# Patient Record
Sex: Female | Born: 1980 | Race: Black or African American | Hispanic: No | Marital: Single | Smoking: Never smoker
Health system: Southern US, Community
[De-identification: ages and names within clinical notes are randomized; demographics above are authoritative.]

## PROBLEM LIST (undated history)

## (undated) ENCOUNTER — Inpatient Hospital Stay (HOSPITAL_COMMUNITY): Payer: Self-pay

## (undated) DIAGNOSIS — O1495 Unspecified pre-eclampsia, complicating the puerperium: Secondary | ICD-10-CM

## (undated) DIAGNOSIS — IMO0002 Reserved for concepts with insufficient information to code with codable children: Secondary | ICD-10-CM

## (undated) DIAGNOSIS — Z8659 Personal history of other mental and behavioral disorders: Secondary | ICD-10-CM

## (undated) DIAGNOSIS — R87629 Unspecified abnormal cytological findings in specimens from vagina: Secondary | ICD-10-CM

## (undated) DIAGNOSIS — D649 Anemia, unspecified: Secondary | ICD-10-CM

## (undated) DIAGNOSIS — R87619 Unspecified abnormal cytological findings in specimens from cervix uteri: Secondary | ICD-10-CM

## (undated) HISTORY — PX: OTHER SURGICAL HISTORY: SHX169

## (undated) HISTORY — DX: Unspecified abnormal cytological findings in specimens from vagina: R87.629

## (undated) HISTORY — DX: Anemia, unspecified: D64.9

## (undated) HISTORY — DX: Reserved for concepts with insufficient information to code with codable children: IMO0002

## (undated) HISTORY — PX: WISDOM TOOTH EXTRACTION: SHX21

## (undated) HISTORY — DX: Personal history of other mental and behavioral disorders: Z86.59

## (undated) HISTORY — DX: Unspecified pre-eclampsia, complicating the puerperium: O14.95

## (undated) HISTORY — DX: Unspecified abnormal cytological findings in specimens from cervix uteri: R87.619

---

## 2008-06-29 ENCOUNTER — Ambulatory Visit: Payer: Self-pay | Admitting: Occupational Medicine

## 2008-06-30 ENCOUNTER — Ambulatory Visit: Payer: Self-pay | Admitting: Family Medicine

## 2008-06-30 DIAGNOSIS — G4700 Insomnia, unspecified: Secondary | ICD-10-CM | POA: Insufficient documentation

## 2008-07-01 ENCOUNTER — Encounter: Payer: Self-pay | Admitting: Family Medicine

## 2008-07-01 LAB — CONVERTED CEMR LAB
Hemoglobin: 11.6 g/dL — ABNORMAL LOW (ref 12.0–15.0)
MCHC: 32.2 g/dL (ref 30.0–36.0)
RBC: 4.56 M/uL (ref 3.87–5.11)
RDW: 14.3 % (ref 11.5–15.5)
TSH: 1.124 microintl units/mL (ref 0.350–4.500)
WBC: 7.3 10*3/uL (ref 4.0–10.5)

## 2008-07-02 LAB — CONVERTED CEMR LAB
Folate: >20 ng/mL
Vitamin B-12: 804 pg/mL (ref 211–911)

## 2008-07-16 ENCOUNTER — Ambulatory Visit: Payer: Self-pay | Admitting: Family Medicine

## 2008-11-16 ENCOUNTER — Other Ambulatory Visit: Admission: RE | Admit: 2008-11-16 | Discharge: 2008-11-16 | Payer: Self-pay | Admitting: Family Medicine

## 2008-11-16 ENCOUNTER — Encounter: Payer: Self-pay | Admitting: Family Medicine

## 2008-11-16 ENCOUNTER — Ambulatory Visit: Payer: Self-pay | Admitting: Family Medicine

## 2008-11-16 LAB — CONVERTED CEMR LAB: Beta hcg, urine, semiquantitative: NEGATIVE

## 2008-11-17 ENCOUNTER — Encounter: Payer: Self-pay | Admitting: Family Medicine

## 2008-11-18 ENCOUNTER — Encounter: Payer: Self-pay | Admitting: Family Medicine

## 2008-11-18 DIAGNOSIS — D649 Anemia, unspecified: Secondary | ICD-10-CM | POA: Insufficient documentation

## 2008-11-18 LAB — CONVERTED CEMR LAB
ALT: 8 units/L (ref 0–35)
AST: 12 units/L (ref 0–37)
Albumin: 3.9 g/dL (ref 3.5–5.2)
Alkaline Phosphatase: 57 units/L (ref 39–117)
Basophils Absolute: 0 10*3/uL (ref 0.0–0.1)
Basophils Relative: 0 % (ref 0–1)
Calcium: 8.3 mg/dL — ABNORMAL LOW (ref 8.4–10.5)
Cholesterol: 182 mg/dL (ref 0–200)
Eosinophils Absolute: 0.1 10*3/uL (ref 0.0–0.7)
Eosinophils Relative: 1 % (ref 0–5)
Glucose, Bld: 91 mg/dL (ref 70–99)
HDL: 65 mg/dL (ref >39)
Lymphocytes Relative: 26 % (ref 12–46)
Lymphs Abs: 1.9 10*3/uL (ref 0.7–4.0)
MCHC: 31.9 g/dL (ref 30.0–36.0)
MCV: 80.4 fL (ref 78.0–100.0)
Monocytes Absolute: 0.5 10*3/uL (ref 0.1–1.0)
Monocytes Relative: 7 % (ref 3–12)
Neutro Abs: 4.8 10*3/uL (ref 1.7–7.7)
Total CHOL/HDL Ratio: 2.8
Total Protein: 6.8 g/dL (ref 6.0–8.3)
UIBC: 264 ug/dL
VLDL: 8 mg/dL (ref 0–40)

## 2008-11-20 ENCOUNTER — Encounter: Payer: Self-pay | Admitting: Family Medicine

## 2008-11-20 DIAGNOSIS — R8789 Other abnormal findings in specimens from female genital organs: Secondary | ICD-10-CM

## 2008-11-25 LAB — CONVERTED CEMR LAB
Hgb A: 97.2 % (ref 96.8–97.8)
Hgb F Quant: 0 % (ref 0.0–2.0)
Hgb S Quant: 0 % (ref 0.0–0.0)

## 2009-03-23 ENCOUNTER — Ambulatory Visit: Payer: Self-pay | Admitting: Family Medicine

## 2009-03-24 ENCOUNTER — Encounter: Payer: Self-pay | Admitting: Obstetrics & Gynecology

## 2009-03-24 LAB — CONVERTED CEMR LAB

## 2009-05-06 ENCOUNTER — Ambulatory Visit: Payer: Self-pay | Admitting: Family Medicine

## 2009-05-06 ENCOUNTER — Encounter: Admission: RE | Admit: 2009-05-06 | Discharge: 2009-05-06 | Payer: Self-pay | Admitting: Family Medicine

## 2009-05-06 DIAGNOSIS — R519 Headache, unspecified: Secondary | ICD-10-CM | POA: Insufficient documentation

## 2009-05-06 DIAGNOSIS — R0789 Other chest pain: Secondary | ICD-10-CM

## 2009-05-06 DIAGNOSIS — R51 Headache: Secondary | ICD-10-CM

## 2009-05-07 LAB — CONVERTED CEMR LAB
ALT: 9 units/L (ref 0–35)
Albumin: 4.4 g/dL (ref 3.5–5.2)
Alkaline Phosphatase: 56 units/L (ref 39–117)
Basophils Absolute: 0 10*3/uL (ref 0.0–0.1)
Basophils Relative: 0 % (ref 0–1)
CO2: 23 meq/L (ref 19–32)
Calcium: 9.4 mg/dL (ref 8.4–10.5)
Creatinine, Ser: 0.63 mg/dL (ref 0.40–1.20)
Eosinophils Relative: 1 % (ref 0–5)
Glucose, Bld: 83 mg/dL (ref 70–99)
HCT: 34.8 % — ABNORMAL LOW (ref 36.0–46.0)
Hemoglobin: 10.9 g/dL — ABNORMAL LOW (ref 12.0–15.0)
Iron: 80 ug/dL (ref 42–145)
Lymphocytes Relative: 30 % (ref 12–46)
Monocytes Absolute: 0.6 10*3/uL (ref 0.1–1.0)
Neutro Abs: 4.1 10*3/uL (ref 1.7–7.7)
Saturation Ratios: 23 % (ref 20–55)
Sodium: 144 meq/L (ref 135–145)
TIBC: 350 ug/dL (ref 250–470)
Total Bilirubin: 0.7 mg/dL (ref 0.3–1.2)

## 2009-05-11 ENCOUNTER — Ambulatory Visit: Payer: Self-pay | Admitting: Obstetrics & Gynecology

## 2009-06-09 ENCOUNTER — Ambulatory Visit: Payer: Self-pay | Admitting: Obstetrics and Gynecology

## 2009-08-31 ENCOUNTER — Ambulatory Visit: Payer: Self-pay | Admitting: Family

## 2009-08-31 ENCOUNTER — Encounter (INDEPENDENT_AMBULATORY_CARE_PROVIDER_SITE_OTHER): Payer: Self-pay | Admitting: *Deleted

## 2009-08-31 DIAGNOSIS — R5383 Other fatigue: Secondary | ICD-10-CM

## 2009-08-31 DIAGNOSIS — R5381 Other malaise: Secondary | ICD-10-CM | POA: Insufficient documentation

## 2009-09-02 ENCOUNTER — Encounter: Payer: Self-pay | Admitting: Family Medicine

## 2009-09-02 ENCOUNTER — Encounter: Payer: Self-pay | Admitting: Family

## 2009-09-02 LAB — CONVERTED CEMR LAB
Basophils Relative: 0 % (ref 0–1)
Eosinophils Absolute: 0.1 10*3/uL (ref 0.0–0.7)
Eosinophils Relative: 1 % (ref 0–5)
Folate: 15 ng/mL
HCT: 35.5 % — ABNORMAL LOW (ref 36.0–46.0)
MCV: 79.8 fL (ref 78.0–100.0)
RDW: 14 % (ref 11.5–15.5)
TSH: 1.309 microintl units/mL (ref 0.350–4.500)
Vitamin B-12: 878 pg/mL (ref 211–911)

## 2009-12-28 ENCOUNTER — Ambulatory Visit: Payer: Self-pay | Admitting: Family Medicine

## 2009-12-28 DIAGNOSIS — F411 Generalized anxiety disorder: Secondary | ICD-10-CM

## 2010-05-17 NOTE — Assessment & Plan Note (Signed)
Summary: chest pain, anxiety, insomnia   Vital Signs:  Patient profile:   30 year old female Height:      67 inches Weight:      165 pounds Pulse rate:   781 / minute BP sitting:   132 / 76  (right arm) Cuff size:   regular  Vitals Entered By: Avon Gully CMA, Duncan Dull) (December 28, 2009 3:32 PM) CC: chest pain since yesterday, hurts when breathing in or certain movements   Primary Care Nzinga Ferran:  Nani Gasser MD  CC:  chest pain since yesterday and hurts when breathing in or certain movements.  History of Present Illness: chest pain since yesterday, hurts when breathing in or certain movements.  Thinks may have had a mild panic attack. Left arm started to tinlge but din't go completey numbn. Then started to get shooting pains in her chest on the right and left side of the chest. Doesn't feel depressed. Not sleping well. Feeling more anxious.  Fatigued on teh weekend. Occ nausea. More frequent HA. Not sure if tension HA. No diaphoresis.  CP is worse with a deep breaths adn will feel a momentary sharp pain. CHest is non tender to touch.  No fever or cold sxs. Has tried valerian root for one night.   Current Medications (verified): 1)  None  Allergies (verified): 1)  * Fluoxetine  Comments:  Nurse/Medical Assistant: The patient's medications and allergies were reviewed with the patient and were updated in the Medication and Allergy Lists. Avon Gully CMA, Duncan Dull) (December 28, 2009 3:33 PM)  Family History: Reviewed history from 05/06/2009 and no changes required. Family History Diabetes 1st degree relative Family History High cholesterol Family History Hypertension Mother with hx CHF.   Social History: Reviewed history from 06/30/2008 and no changes required. Single.  3 kids. Single mom.   Never Smoked Alcohol use-no Drug use-no Regular exercise-no  Physical Exam  General:  Well-developed,well-nourished,in no acute distress; alert,appropriate and  cooperative throughout examination Head:  Normocephalic and atraumatic without obvious abnormalities. No apparent alopecia or balding. Neck:  No deformities, masses, or tenderness noted. NO TM.  Lungs:  Normal respiratory effort, chest expands symmetrically. Lungs are clear to auscultation, no crackles or wheezes. Heart:  Normal rate and regular rhythm. S1 and S2 normal without gallop, murmur, click, rub or other extra sounds. No carotid or abdominal bruits.  Abdomen:  Bowel sounds positive,abdomen soft and non-tender without masses, organomegaly or hernias noted. Extremities:  No LE edema.  Skin:  no rashes.   Cervical Nodes:  No lymphadenopathy noted Psych:  Cognition and judgment appear intact. Alert and cooperative with normal attention span and concentration. No apparent delusions, illusions, hallucinations   Impression & Recommendations:  Problem # 1:  CHEST PAIN, ATYPICAL (ICD-786.59)  Last 2 sets of lab were normal EKG today shows NSR, no acute changes GAD-7 score today is 19 and she is not sleeping. Discussed treatment for her anxiety and to work on sleep and see if the chest resovles. She has no red flags to indicate that this is cardiac.   Orders: EKG w/ Interpretation (93000)  Problem # 2:  INSOMNIA (ICD-780.52) Trial of medication. Discusssed use of trazodone to avoid dependency. She can also try the valerian root.   F/U in 1 month. Also reviewed sleep hygiene.   Problem # 3:  ANXIETY DISORDER, GENERALIZED (ICD-300.02) Discussed dx. HIhgly recommend therapy and medication such as SSSRI but she was not interestedin a daily medication.Did give her a few tabs of xanax  to use for rescure but explained that this really need to last 2 months.   Her updated medication list for this problem includes:    Trazodone Hcl 50 Mg Tabs (Trazodone hcl) .Marland Kitchen... 1/2 to 1 tab at bedtime as needed insomnia.    Alprazolam 0.25 Mg Tabs (Alprazolam) .Marland Kitchen... Take 1-2 tablet by mouth once a day as  needed for anxiety  Complete Medication List: 1)  Trazodone Hcl 50 Mg Tabs (Trazodone hcl) .... 1/2 to 1 tab at bedtime as needed insomnia. 2)  Alprazolam 0.25 Mg Tabs (Alprazolam) .... Take 1-2 tablet by mouth once a day as needed for anxiety  Patient Instructions: 1)  Start the trazodone at bedtime 2)  Please schedule a follow-up appointment in 1 month for sleep and mood. 3)  For constipation increase water intake.   4)  Can start benefiber up to 3 x a day or a stool softener 5)  Can use miralax to get things going. 6)    Prescriptions: ALPRAZOLAM 0.25 MG TABS (ALPRAZOLAM) Take 1-2 tablet by mouth once a day as needed for anxiety  #15 x 0   Entered and Authorized by:   Nani Gasser MD   Signed by:   Nani Gasser MD on 12/28/2009   Method used:   Printed then faxed to ...       [facility]  [place] [phone][facility] (retail[facility]       [address] [place], [place]  [address]       Ph: [phone] or [phone]       Fax: [phone]   RxID:   [id number] TRAZODONE HCL 50 MG TABS (TRAZODONE HCL) 1/2 to 1 tab at bedtime as needed insomnia.  #30 x 1   Entered and Authorized by:   Nani Gasser MD   Signed by:   Nani Gasser MD on 12/28/2009   Method used:   [facility]ronically to        [facility] [phone]* (retail)       [address] [place], [place]  [address]       Ph: [phone] or [phone]       Fax: [phone]   RxID:   [id number]

## 2010-05-17 NOTE — Assessment & Plan Note (Signed)
Summary: Atypical CP, HA   Vital Signs:  Patient profile:   30 year old female Height:      67 inches Weight:      156 pounds BMI:     24.52 O2 Sat:      100 % on Room air Pulse rate:   70 / minute BP sitting:   113 / 70  (left arm) Cuff size:   regular  Vitals Entered By: Kathlene November (May 06, 2009 8:43 AM)  O2 Flow:  Room air CC: H/A alot lately- no photophobia or nausea- does get some pain in left side of chest as well   Primary Care Provider:  Nani Gasser MD  CC:  H/A alot lately- no photophobia or nausea- does get some pain in left side of chest as well.  History of Present Illness: H/A alot lately- throbbint, no photophobia or nausea- does get some pain in left side of chest as well. Denies any CP or pressure. Almost hard to get a breath in.  a couple of days ago started getting palpitations.  Tried laying on different sides to calm it down. Then that side of chest became sore.  The Chest pain lasted a couple of hours.  Palpitations lasted about an hour (has happened 2 nights in a row).  doesn' tfeel very anxious or stressed out. Normally drinks 2 sodas a day but this is normal for her. no increase last couple of days. no GERD sxs.   HA is sometimes execabates by exercise. Getting HA every 2 days. Can last for hours.  Usually take IBU  200mg  and that usually helps. No dizziness.  Feel s lightheaded.   Current Medications (verified): 1)  None  Allergies (verified): 1)  * Fluoxetine  Comments:  Nurse/Medical Assistant: The patient's medications and allergies were reviewed with the patient and were updated in the Medication and Allergy Lists. Kathlene November (May 06, 2009 8:44 AM)  Past History:  Social History: Last updated: 06/30/2008 Single.  3 kids. Single mom.   Never Smoked Alcohol use-no Drug use-no Regular exercise-no  Family History: Family History Diabetes 1st degree relative Family History High cholesterol Family History  Hypertension Mother with hx CHF.   Social History: Reviewed history from 06/30/2008 and no changes required. Single.  3 kids. Single mom.   Never Smoked Alcohol use-no Drug use-no Regular exercise-no  Physical Exam  General:  Well-developed,well-nourished,in no acute distress; alert,appropriate and cooperative throughout examination Head:  Normocephalic and atraumatic without obvious abnormalities. No apparent alopecia or balding. Eyes:  No corneal or conjunctival inflammation noted. EOMI. Perrla. Ears:  External ear exam shows no significant lesions or deformities.  Otoscopic examination reveals clear canals, tympanic membranes are intact bilaterally without bulging, retraction, inflammation or discharge. Hearing is grossly normal bilaterally. Nose:  External nasal examination shows no deformity or inflammation. Nasal mucosa are pink and moist without lesions or exudates. Mouth:  Oral mucosa and oropharynx without lesions or exudates.  Teeth in good repair. Neck:  No deformities, masses, or tenderness noted. No TM.  Lungs:  Normal respiratory effort, chest expands symmetrically. Lungs are clear to auscultation, no crackles or wheezes. Heart:  Normal rate and regular rhythm. S1 and S2 normal without gallop, murmur, click, rub or other extra sounds.No mummur lying flat. No abdominal or carotid bruits.  Pulses:  Radial 2+  Extremities:  No LE edema.  Skin:  no rashes.   Cervical Nodes:  No lymphadenopathy noted Psych:  Cognition and judgment appear intact. Alert and  cooperative with normal attention span and concentration. No apparent delusions, illusions, hallucinations   Impression & Recommendations:  Problem # 1:  CHEST PAIN, ATYPICAL (ICD-786.59) Unclear etiology.  Will rule out thyroid d/o, anemia, etc.  If normal and has another episode then will schedule for cardiac monitor.  She is not fasting this AM. CXR to rule out any pulmonary process. EKG with NSR, rate 58 bpm, no acute  changes.   Orders: T-Comprehensive Metabolic Panel [id number]) T-CBC w/Diff [id number]) T-TSH [phone]) T-Iron [id number]) T-Iron Binding Capacity (TIBC) ([id number]) T-Chest x-ray, 2 views (71020) EKG w/ Interpretation (93000)  Problem # 2:  HEADACHE (ICD-784.0) No visoin changes.  Pain is throbbing andusuall relieved with 200mg  IBU. consider tension vs migraine, thought pt feels the pain is not severe and no photo or phonophobia.  Will rule out thyroid and anmeia as above. I really dn't think she is sleeing well and this may be part of the issue.

## 2010-05-17 NOTE — Progress Notes (Signed)
Summary: GAD 7/[name]  GAD 7/[name]   Imported By: Sherian Rein 01/07/2010 11:54:21  _____________________________________________________________________  External Attachment:    Type:   Image     Comment:   External Document

## 2010-05-17 NOTE — Letter (Signed)
Summary: Out of Work  [facility]  [address]   [place], [place] [address]   Phone: [phone]  Fax: [phone]    Aug 31, 2009   Employee:  MAUDENE STOTLER    To Whom It May Concern:   For Medical reasons, please excuse the above named employee from work for the following dates:  Start:   08/31/2009  End:   08/31/2009  If you need additional information, please feel free to contact our office.         Sincerely,    Mervin Kung, CMA Sandford Craze, FNP

## 2010-05-17 NOTE — Assessment & Plan Note (Signed)
Summary: DIZZY SPILLS//VGJ   Vital Signs:  Patient profile:   30 year old female Height:      67 inches Weight:      160.25 pounds BMI:     25.19 Temp:     98.5 degrees F oral Pulse rate:   67 / minute Pulse rhythm:   regular Resp:     18 per minute BP sitting:   133 / 79  (right arm) Cuff size:   regular  Vitals Entered By: Mervin Kung CMA (Aug 31, 2009 4:43 PM) CC: room 5  Pt states she has had headaches nearly every day x 3 weeks, dizziness x 1 week and feels like she could sleep all the time. Is Patient Diabetic? No   Primary Care Provider:  Nani Gasser MD  CC:  room 5  Pt states she has had headaches nearly every day x 3 weeks and dizziness x 1 week and feels like she could sleep all the time.Marland Kitchen  History of Present Illness: Patient is a 30 year old female who presents with complaint of headache, mild dizziness and fatigue.  Notes that her ears have been "stopped" up.  Has been using motrin PRN for HA with good relief. Headaches have been going on for " a while" but have beome more frequent and are associated with extreme fatigue.  Denies palpitations.  Notes + history of anemia but denies heavy menstrual bleeding.  Allergies: 1)  * Fluoxetine  Physical Exam  General:  Well-developed,well-nourished,in no acute distress; alert,appropriate and cooperative throughout examination Head:  Normocephalic and atraumatic without obvious abnormalities. No apparent alopecia or balding. Eyes:  PERRLA Ears:  Bilateral TM's are dull with light yellow effusions.  No erythema or bulging noted Neck:  No deformities, masses, or tenderness noted. Lungs:  Normal respiratory effort, chest expands symmetrically. Lungs are clear to auscultation, no crackles or wheezes. Heart:  Normal rate and regular rhythm. S1 and S2 normal without gallop, murmur, click, rub or other extra sounds. Abdomen:  Bowel sounds positive,abdomen soft and non-tender without masses, organomegaly or hernias  noted.   Impression & Recommendations:  Problem # 1:  HEADACHE (ICD-784.0) Assessment Deteriorated Patient reports that she has good HA relief with as needed motrin.  Will continue as needed.    Problem # 2:  FATIGUE (ICD-780.79) Assessment: New I am concerned that she may have worsening anemia as cause for her fatigue and HA.  Last Hgb in 1/11 was 10.7.  Will check CBC and TSH.   Orders: T-TSH [id number]) T-CBC w/Diff [id number])  Problem # 3:  OTITIS MEDIA (ICD-382.9) Assessment: New Will treat with amoxicillin.  This also may be contributing to her fatigue. Her updated medication list for this problem includes:    Amoxicillin 500 Mg Cap (Amoxicillin) .Marland Kitchen... Take 1 capsule by mouth three times a day x 10 days  Complete Medication List: 1)  Amoxicillin 500 Mg Cap (Amoxicillin) .... Take 1 capsule by mouth three times a day x 10 days  Patient Instructions: 1)  Please complete lab work and follow up in 2 weeks Prescriptions: AMOXICILLIN 500 MG CAP (AMOXICILLIN) Take 1 capsule by mouth three times a day X 10 days  #30 x 0   Entered and Authorized by:   Lemont Fillers FNP   Signed by:   Lemont Fillers FNP on 09/01/2009   Method used:   [facility]        [facility] [phone]* (retail)  [address]       [place], [place]  [address]       Ph: [phone] or [phone]       Fax: [phone]   RxID:   [id number]   Current Allergies (reviewed today): * FLUOXETINE

## 2010-10-05 ENCOUNTER — Encounter: Payer: Self-pay | Admitting: Family Medicine

## 2010-10-06 ENCOUNTER — Encounter: Payer: Self-pay | Admitting: Family Medicine

## 2010-10-06 ENCOUNTER — Ambulatory Visit (INDEPENDENT_AMBULATORY_CARE_PROVIDER_SITE_OTHER): Payer: Self-pay | Admitting: Family Medicine

## 2010-10-06 ENCOUNTER — Other Ambulatory Visit (HOSPITAL_COMMUNITY)
Admission: RE | Admit: 2010-10-06 | Discharge: 2010-10-06 | Disposition: A | Discharge status: Home / Self Care | Payer: Self-pay | Source: Ambulatory Visit | Attending: Family Medicine | Admitting: Family Medicine

## 2010-10-06 ENCOUNTER — Other Ambulatory Visit: Payer: Self-pay | Admitting: Family Medicine

## 2010-10-06 DIAGNOSIS — Z124 Encounter for screening for malignant neoplasm of cervix: Secondary | ICD-10-CM

## 2010-10-06 DIAGNOSIS — Z0000 Encounter for general adult medical examination without abnormal findings: Secondary | ICD-10-CM

## 2010-10-06 DIAGNOSIS — Z01419 Encounter for gynecological examination (general) (routine) without abnormal findings: Secondary | ICD-10-CM | POA: Insufficient documentation

## 2010-10-06 DIAGNOSIS — Z23 Encounter for immunization: Secondary | ICD-10-CM

## 2010-10-06 MED ORDER — TETANUS-DIPHTH-ACELL PERTUSSIS 5-2.5-18.5 LF-MCG/0.5 IM SUSP
0.5000 mL | Freq: Once | INTRAMUSCULAR | Status: AC
  Administered 2010-10-06: 0.5 mL via INTRAMUSCULAR

## 2010-10-06 NOTE — Patient Instructions (Signed)
You received your Tdap today Regular exercise and healthy diet.  We will call you with  Your lab results next week.

## 2010-10-06 NOTE — Progress Notes (Signed)
  Subjective:     [name] is a 30 y.o. female and is here for a comprehensive physical exam. The patient reports problems - OCc episdoes of tingling in her chest, light headed an ddizzy feeling.  Has had several episodes. Lasts a couple of hours.  Had eaten and drank water. Sometimes happens after frinking caffeine and eating sugary foods. .  History   Social History  . Marital Status: Single    Spouse Name: N/A    Number of Children: N/A  . Years of Education: N/A   Occupational History  . Not on file.   Social History Main Topics  . Smoking status: Never Smoker   . Smokeless tobacco: Not on file  . Alcohol Use: No  . Drug Use: No  . Sexually Active:      single, 3 kids, single mom, no regular exercise   Other Topics Concern  . Not on file   Social History Narrative  . No narrative on file   Health Maintenance  Topic Date Due  . Pap Smear  08/23/1999  . Tetanus/tdap  08/22/2000    The following portions of the patient's history were reviewed and updated as appropriate: allergies, current medications, past family history, past medical history, past social history and past surgical history.  Review of Systems A comprehensive review of systems was negative.   Objective:    BP 118/80  Pulse 57  Ht 5\' 7"  (1.702 m)  Wt 169 lb (76.658 kg)  BMI 26.47 kg/m2  LMP 09/20/2010 General appearance: alert, cooperative and appears stated age Head: Normocephalic, without obvious abnormality, atraumatic Eyes: Conjunctivae is clear, extra movements intact, pupils equal and react to light and accommodation. Ears: normal TM's and external ear canals both ears Nose: Nares normal. Septum midline. Mucosa normal. No drainage or sinus tenderness. Throat: lips, mucosa, and tongue normal; teeth and gums normal Neck: no adenopathy, no carotid bruit, supple, symmetrical, trachea midline and thyroid not enlarged, symmetric, no tenderness/mass/nodules Back: symmetric, no curvature. ROM  normal. No CVA tenderness. Lungs: clear to auscultation bilaterally Breasts: normal appearance, no masses or tenderness Heart: regular rate and rhythm, S1, S2 normal, no murmur, click, rub or gallop Abdomen: soft, non-tender; bowel sounds normal; no masses,  no organomegaly Pelvic: cervix normal in appearance, external genitalia normal, no adnexal masses or tenderness, no cervical motion tenderness, rectovaginal septum normal, uterus normal size, shape, and consistency and vagina normal without discharge Extremities: extremities normal, atraumatic, no cyanosis or edema Pulses: 2+ and symmetric Skin: Skin color, texture, turgor normal. No rashes or lesions Lymph nodes: Cervical, supraclavicular, and axillary nodes normal.    Assessment:    Healthy female exam.  Plan:     See After Visit Summary for Counseling Recommendations   #1 will call with Pap results, she does have a history of a previously abnormal Pap smear. #2 encouraged healthy diet regular exercise #3 we'll get screening lab work and will call for results next week #4 she was given her Tdap Injection today.

## 2010-10-07 ENCOUNTER — Telehealth: Payer: Self-pay | Admitting: Family Medicine

## 2010-10-07 LAB — COMPLETE METABOLIC PANEL WITH GFR
ALT: <8 U/L (ref 0–35)
AST: 12 U/L (ref 0–37)
Albumin: 4 g/dL (ref 3.5–5.2)
Alkaline Phosphatase: 54 U/L (ref 39–117)
BUN: 10 mg/dL (ref 6–23)
CO2: 23 mEq/L (ref 19–32)
Calcium: 8.8 mg/dL (ref 8.4–10.5)
Chloride: 106 mEq/L (ref 96–112)
Creat: 0.66 mg/dL (ref 0.50–1.10)
GFR, Est African American: >60 mL/min (ref >60)
GFR, Est Non African American: >60 mL/min (ref >60)
Glucose, Bld: 78 mg/dL (ref 70–99)
Potassium: 3.5 mEq/L (ref 3.5–5.3)
Sodium: 138 mEq/L (ref 135–145)
Total Bilirubin: 0.6 mg/dL (ref 0.3–1.2)
Total Protein: 6.9 g/dL (ref 6.0–8.3)

## 2010-10-07 LAB — CBC
MCH: 25.1 pg — ABNORMAL LOW (ref 26.0–34.0)
Platelets: 260 10*3/uL (ref 150–400)
RBC: 4.19 MIL/uL (ref 3.87–5.11)
WBC: 7.8 10*3/uL (ref 4.0–10.5)

## 2010-10-07 LAB — LIPID PANEL
HDL: 54 mg/dL (ref >39)
LDL Cholesterol: 126 mg/dL — ABNORMAL HIGH (ref 0–99)
Triglycerides: 57 mg/dL (ref <150)

## 2010-10-07 LAB — TSH: TSH: 0.735 u[IU]/mL (ref 0.350–4.500)

## 2010-10-07 NOTE — Telephone Encounter (Signed)
Dr. Linford Arnold, I contacted the patient with her lab results, but the last line of your note doesn't make sense to me.  Please advise if I need to add on add'l lab tests. Jarvis Newcomer, LPN Domingo Dimes

## 2010-10-07 NOTE — Telephone Encounter (Signed)
Call patient complete metabolic panel is normal. Overall cholesterol looks good except for LDL cholesterol elevated at 126. Goal isn under 100 I encourage eating a low-fat healthy diet, in addition to regular exercise, and let's recheck in one year. Her hemoglobin is still low at 10.5. The overall this is stable for her. Please call anesthetic and an iron and B12 level.

## 2010-10-07 NOTE — Telephone Encounter (Signed)
Sorry please call [facility] and see if can add a Vit B12 and iron level.

## 2010-10-10 LAB — VITAMIN B12: Vitamin B-12: 428 pg/mL (ref 211–911)

## 2010-10-10 LAB — IRON AND TIBC
%SAT: 19 % — ABNORMAL LOW (ref 20–55)
TIBC: 346 ug/dL (ref 250–470)
UIBC: 280 ug/dL

## 2010-10-10 NOTE — Telephone Encounter (Signed)
Notified [facility] to see if Vit B12 and iron could be added on. Plan:  [facility] rep and added on ferritin, iron, TIBC, Vit B12. Jarvis Newcomer, LPN Domingo Dimes

## 2010-10-11 ENCOUNTER — Telehealth: Payer: Self-pay | Admitting: Family Medicine

## 2010-10-11 NOTE — Telephone Encounter (Signed)
Pls let pt know that her labs came back normal other than a borderline low iron level.  She can add an OTC iron supplement once daily (take with orange juice to improve absorption) in addition to her multivitamin.  This should get her iron levels up and help with any fatigue this is causing.  Plan to recheck iron levels in 3-4 mos.

## 2010-10-11 NOTE — Telephone Encounter (Signed)
Tried to notify the patient regarding her lab values, but cell number voice mail box full and couldn't leave mess, and no ans after several rings.  Will have to try again later. Jarvis Newcomer, LPN Domingo Dimes

## 2010-10-13 ENCOUNTER — Telehealth: Payer: Self-pay | Admitting: Family Medicine

## 2010-10-13 NOTE — Telephone Encounter (Signed)
Pls let pt know that her pap smear came back normal.  Repeat in 1 yr. 

## 2010-10-13 NOTE — Telephone Encounter (Signed)
Mail box full and no answer at home #.

## 2010-10-17 NOTE — Telephone Encounter (Signed)
Pt advised of test results and rec.

## 2010-10-20 NOTE — Telephone Encounter (Signed)
Informed the patient of her recent lab values and gave recommendations according to the provider orders.  Could not leave a mess with instructions the first go around because mail box on pt cell number was full.  However; left voice mail mess today with instructions.  Told to start OTC iron supplement once daily along with OJ to help with absorption. Jarvis Newcomer, LPN Domingo Dimes

## 2011-01-23 ENCOUNTER — Ambulatory Visit (INDEPENDENT_AMBULATORY_CARE_PROVIDER_SITE_OTHER): Payer: Self-pay | Admitting: Family Medicine

## 2011-01-23 ENCOUNTER — Encounter: Payer: Self-pay | Admitting: Family Medicine

## 2011-01-23 VITALS — BP 128/80 | HR 77 | Wt 172.0 lb

## 2011-01-23 DIAGNOSIS — R0789 Other chest pain: Secondary | ICD-10-CM

## 2011-01-23 DIAGNOSIS — R51 Headache: Secondary | ICD-10-CM

## 2011-01-23 MED ORDER — AMITRIPTYLINE HCL 50 MG PO TABS: 50.0000 mg | ORAL_TABLET | Freq: Every day | ORAL | Status: DC

## 2011-01-23 NOTE — Progress Notes (Signed)
  Subjective:    Patient ID: [name], female    DOB: [birth year], 30 y.o.   MRN: [id number]  HPI  Has been stressed but actually feels like she has been balancing this better. Still having CP on and off. Has had 2 normal ekgs.  CP has actually been less frequent. Sometimes happens with her HA. Now getting HA daily.  Gas relievers seem to help her chest pain. Heart disease on her mom's side of family. CP often happens weekly and not necessarily exacerbated by activity.  Usually will last several hours.  No known triggers of HA.Says the daily ha have been more mild, not severe.   Review of Systems     Objective:   Physical Exam  Constitutional: She is oriented to person, place, and time. She appears well-developed and well-nourished.  HENT:  Head: Normocephalic and atraumatic.  Neck: Neck supple. No thyromegaly present.  Cardiovascular: Normal rate, regular rhythm and normal heart sounds.   Pulmonary/Chest: Effort normal and breath sounds normal.  Lymphadenopathy:    She has no cervical adenopathy.  Neurological: She is alert and oriented to person, place, and time.  Skin: Skin is warm and dry.  Psychiatric: She has a normal mood and affect. Her behavior is normal.          Assessment & Plan:  Migraines - Not well controlled.  Discussed starting amitryptiline at bedtime to help with her HA, mood, and sleep. Work on [employer] as much as possible. F/U in 6-8 wks.   Atypical chest pain- will refer for treadmill stress test, thought I explained unlikely to be cardiac but she is so worried that I think this will help relieve some of her anxiety bout this.

## 2011-02-08 ENCOUNTER — Encounter: Payer: Self-pay | Admitting: Physician Assistant

## 2011-02-09 ENCOUNTER — Encounter: Payer: Self-pay | Admitting: Physician Assistant

## 2011-04-26 ENCOUNTER — Telehealth: Payer: Self-pay | Admitting: *Deleted

## 2011-04-26 NOTE — Telephone Encounter (Signed)
She needs appt with me or Dr. Thurmond Butts. i started her on amitryptiline for chronic daily HA in October and she never followed up like she was supposed to so we could adjust her medication.

## 2011-04-26 NOTE — Telephone Encounter (Signed)
Pt states that she is having headaches 1-2 a week and sometimes they stay for days. She would like a referral to neurology for the headaches.

## 2011-04-27 NOTE — Telephone Encounter (Signed)
Pt informed to schedule appt for f/u

## 2011-05-04 ENCOUNTER — Ambulatory Visit: Payer: Self-pay | Admitting: Family Medicine

## 2011-05-11 ENCOUNTER — Ambulatory Visit: Payer: Self-pay | Admitting: Family Medicine

## 2011-05-11 DIAGNOSIS — Z0289 Encounter for other administrative examinations: Secondary | ICD-10-CM

## 2011-06-23 ENCOUNTER — Ambulatory Visit (INDEPENDENT_AMBULATORY_CARE_PROVIDER_SITE_OTHER): Payer: BC Managed Care – PPO | Admitting: Family Medicine

## 2011-06-23 ENCOUNTER — Encounter: Payer: Self-pay | Admitting: Family Medicine

## 2011-06-23 VITALS — BP 112/73 | HR 54 | Ht 66.0 in | Wt 168.0 lb

## 2011-06-23 DIAGNOSIS — F411 Generalized anxiety disorder: Secondary | ICD-10-CM

## 2011-06-23 DIAGNOSIS — R51 Headache: Secondary | ICD-10-CM

## 2011-06-23 DIAGNOSIS — F419 Anxiety disorder, unspecified: Secondary | ICD-10-CM

## 2011-06-23 MED ORDER — CITALOPRAM HYDROBROMIDE 20 MG PO TABS: ORAL_TABLET | ORAL | Status: DC

## 2011-06-23 NOTE — Progress Notes (Signed)
  Subjective:    Patient ID: [name], female    DOB: 1980/12/15, 31 y.o.   MRN: [id number]  HPI Since I last saw her has been to [facility] for left sided sharp HA.  Says was given a cocktail and that seemed to relieve her symptoms. Patient did have a CT scan of her head about a year and a half ago that was normal. She started having a headache began about a week ago.  Her GM recently passed away. SHe has had HA for a week. This headache has also been one-sided and sharp in nature. Her headaches tend to be on the left side of her head. It has been persistent each day. She has tried some over-the-counter medications, they provide minimal relief.  She is also concerned about her anxiety lately. Was on citalopram for awhile.  Hasn't been using her xanan and it made her feel drowsy. She never took the amitryptiline. She says her headaches actually got better on her own and so she never filled the medication. Currently she is having Having interuptive thoughts. Occ feels dizzy with her HA, especially when she is stressed out. She is a very high level of anxiety. She did take citalopram in the past and did very well on it without any side effects. She is not sleeping well.    Review of Systems     Objective:   Physical Exam  Constitutional: She is oriented to person, place, and time. She appears well-developed and well-nourished.  HENT:  Head: Normocephalic and atraumatic.  Eyes: Conjunctivae are normal. Pupils are equal, round, and reactive to light.  Neck: Neck supple. No thyromegaly present.  Cardiovascular: Normal rate, regular rhythm and normal heart sounds.   Pulmonary/Chest: Effort normal and breath sounds normal.  Lymphadenopathy:    She has no cervical adenopathy.  Neurological: She is alert and oriented to person, place, and time. No cranial nerve deficit.  Skin: Skin is warm and dry.  Psychiatric: She has a normal mood and affect. Her behavior is normal. Judgment and thought  content normal.          Assessment & Plan:  Anxiety - PHQ- 9 score 21. She is not doing well overall. I think the creatinine to restart her citalopram. She was previously on 20 mg.  Will restart her citalopram.  F/U in 1 months. I really encouraged her to followup. The last month saw her for headache she was supposed to followup and did not. I explained that I would like to work on her mood because if we can get this under better control than this may also improve her headaches. If not then we can consider something prophylactic again.  Headache-I see no red flag symptoms in her headaches. These appear to be stress related. Work on reducing her tension and overall anxiety and see if the headaches improve. If not then we can consider a prophylactic medication. F/u in 1 months.   25 minutes spent face-to-face encounter and discussion about her care plan.

## 2011-08-10 ENCOUNTER — Ambulatory Visit (INDEPENDENT_AMBULATORY_CARE_PROVIDER_SITE_OTHER): Payer: BC Managed Care – PPO | Admitting: Family Medicine

## 2011-08-10 ENCOUNTER — Encounter: Payer: Self-pay | Admitting: Family Medicine

## 2011-08-10 ENCOUNTER — Other Ambulatory Visit: Payer: Self-pay | Admitting: Family Medicine

## 2011-08-10 VITALS — BP 130/77 | HR 74 | Ht 67.0 in | Wt 168.0 lb

## 2011-08-10 DIAGNOSIS — F411 Generalized anxiety disorder: Secondary | ICD-10-CM

## 2011-08-10 DIAGNOSIS — F419 Anxiety disorder, unspecified: Secondary | ICD-10-CM

## 2011-08-10 DIAGNOSIS — N760 Acute vaginitis: Secondary | ICD-10-CM

## 2011-08-10 DIAGNOSIS — R51 Headache: Secondary | ICD-10-CM

## 2011-08-10 MED ORDER — CITALOPRAM HYDROBROMIDE 20 MG PO TABS: 20.0000 mg | ORAL_TABLET | Freq: Every day | ORAL | Status: DC

## 2011-08-10 NOTE — Progress Notes (Signed)
  Subjective:    Patient ID: [name], female    DOB: 22-Jul-1980, 31 y.o.   MRN: [id number]  HPI Anxiety - doing very well on the fluoxetine.  Says it has really helped her mood and HA.  No side effects.    Having vaginal irritation after her period. No ithcing. No change in D/C.  Feels uncomfortable, occ stings. Has looked with mirror and no rash. No odor. Due for next period next week. She does wear pads during her period. No tampons.    Review of Systems     Objective:   Physical Exam  Constitutional: She appears well-developed and well-nourished.  Pulmonary/Chest: Effort normal and breath sounds normal.  Skin: Skin is warm and dry.  Psychiatric: She has a normal mood and affect. Her behavior is normal.          Assessment & Plan:  Anxiety - GAD-7 score of 7 today.  Fantatstic results.  F/U in 6 weeks.  I think she will continue to get improvement on the medication.   HA- much improved with her anxiety under control.   Vaginitis - will have her perform wet prep. Will call with results. If neg and sxs persist then will have her f/u for pelvic exam. Also consider this could be irritation from the pads she is wearing during her period. Also consider switching to tampons during her next cycle to see if it makes a difference or trying to use pads that are fragrance free and free of perfumes or dyes. Also changing  pads frequently to help wick away the moisture from her skin.

## 2011-08-11 ENCOUNTER — Other Ambulatory Visit: Payer: Self-pay | Admitting: Family Medicine

## 2011-08-11 LAB — WET PREP, GENITAL
Trich, Wet Prep: NONE SEEN
WBC, Wet Prep HPF POC: NONE SEEN
Yeast Wet Prep HPF POC: NONE SEEN

## 2011-08-11 MED ORDER — METRONIDAZOLE 500 MG PO TABS: 500.0000 mg | ORAL_TABLET | Freq: Two times a day (BID) | ORAL | Status: AC

## 2011-08-15 ENCOUNTER — Encounter: Payer: Self-pay | Admitting: *Deleted

## 2011-09-14 ENCOUNTER — Encounter: Payer: Self-pay | Admitting: *Deleted

## 2011-09-21 ENCOUNTER — Ambulatory Visit: Payer: BC Managed Care – PPO | Admitting: Family Medicine

## 2011-09-21 DIAGNOSIS — Z0289 Encounter for other administrative examinations: Secondary | ICD-10-CM

## 2011-09-28 ENCOUNTER — Telehealth: Payer: Self-pay | Admitting: Family Medicine

## 2011-10-16 NOTE — Telephone Encounter (Signed)
Error

## 2012-01-25 ENCOUNTER — Encounter: Payer: Self-pay | Admitting: Family Medicine

## 2012-01-25 ENCOUNTER — Other Ambulatory Visit (HOSPITAL_COMMUNITY)
Admission: RE | Admit: 2012-01-25 | Discharge: 2012-01-25 | Disposition: A | Discharge status: Home / Self Care | Payer: BC Managed Care – PPO | Source: Ambulatory Visit | Attending: Family Medicine | Admitting: Family Medicine

## 2012-01-25 ENCOUNTER — Ambulatory Visit (INDEPENDENT_AMBULATORY_CARE_PROVIDER_SITE_OTHER): Payer: BC Managed Care – PPO | Admitting: Family Medicine

## 2012-01-25 VITALS — BP 134/75 | HR 69 | Ht 67.0 in | Wt 169.0 lb

## 2012-01-25 DIAGNOSIS — Z1151 Encounter for screening for human papillomavirus (HPV): Secondary | ICD-10-CM | POA: Insufficient documentation

## 2012-01-25 DIAGNOSIS — Z01419 Encounter for gynecological examination (general) (routine) without abnormal findings: Secondary | ICD-10-CM

## 2012-01-25 DIAGNOSIS — Z23 Encounter for immunization: Secondary | ICD-10-CM

## 2012-01-25 DIAGNOSIS — Z113 Encounter for screening for infections with a predominantly sexual mode of transmission: Secondary | ICD-10-CM | POA: Insufficient documentation

## 2012-01-25 MED ORDER — CITALOPRAM HYDROBROMIDE 20 MG PO TABS: 20.0000 mg | ORAL_TABLET | Freq: Every day | ORAL | Status: DC

## 2012-01-25 NOTE — Progress Notes (Signed)
  Subjective:     [name] is a 31 y.o. female and is here for a comprehensive physical exam. The patient reports no problems.  History   Social History  . Marital Status: Single    Spouse Name: N/A    Number of Children: 3  . 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 Smoking status: Never Smoker   . Smokeless tobacco: Not on file  . Alcohol Use: No  . Drug Use: No  . Sexually Active:      single, 3 kids, single mom, no regular exercise   Other Topics Concern  . Not on file   Social History Narrative   1-2 caffeinated drinks per day. No regular exercise.     Health Maintenance  Topic Date Due  . Influenza Vaccine  12/16/2012  . Pap Smear  01/25/2015  . Tetanus/tdap  10/05/2020    The following portions of the patient's history were reviewed and updated as appropriate: allergies, current medications, past family history, past medical history, past social history, past surgical history and problem list.  Review of Systems A comprehensive review of systems was negative.   Objective:    BP 134/75  Pulse 69  Ht 5\' 7"  (1.702 m)  Wt 169 lb (76.658 kg)  BMI 26.47 kg/m2 General appearance: alert, cooperative and appears stated age Head: Normocephalic, without obvious abnormality, atraumatic Eyes: conj clear, EOMi PEERLA Ears: normal TM's and external ear canals both ears Nose: Nares normal. Septum midline. Mucosa normal. No drainage or sinus tenderness. Throat: lips, mucosa, and tongue normal; teeth and gums normal Neck: no adenopathy, no carotid bruit, no JVD, supple, symmetrical, trachea midline and thyroid not enlarged, symmetric, no tenderness/mass/nodules Back: symmetric, no curvature. ROM normal. No CVA tenderness. Lungs: clear to auscultation bilaterally Breasts: normal appearance, no masses or tenderness Heart: RRR, with soft 2/6 SEM best heard at the Right upper sternal border.  Abdomen: soft,  non-tender; bowel sounds normal; no masses,  no organomegaly Pelvic: cervix normal in appearance, external genitalia normal, no adnexal masses or tenderness, no cervical motion tenderness, rectovaginal septum normal, uterus normal size, shape, and consistency and vagina normal without discharge Extremities: extremities normal, atraumatic, no cyanosis or edema Pulses: 2+ and symmetric Skin: Skin color, texture, turgor normal. No rashes or lesions Lymph nodes: Cervical, supraclavicular, and axillary nodes normal. Neurologic: Grossly normal    Assessment:    Healthy female exam.      Plan:     See After Visit Summary for Counseling Recommendations   Start a regular exercise program and make sure you are eating a healthy diet Try to eat 4 servings of dairy a day Your vaccines are up to date.   Flu vaccine given today.

## 2012-01-25 NOTE — Patient Instructions (Addendum)
Start a regular exercise program and make sure you are eating a healthy diet Try to eat 4 servings of dairy a day  Your vaccines are up to date.   

## 2012-01-26 LAB — LIPID PANEL
Cholesterol: 198 mg/dL (ref 0–200)
HDL: 61 mg/dL (ref >39)
LDL Cholesterol: 128 mg/dL — ABNORMAL HIGH (ref 0–99)
Total CHOL/HDL Ratio: 3.2 Ratio
Triglycerides: 44 mg/dL (ref <150)

## 2012-01-26 LAB — COMPLETE METABOLIC PANEL WITH GFR
Albumin: 4.1 g/dL (ref 3.5–5.2)
Alkaline Phosphatase: 62 U/L (ref 39–117)
Calcium: 9.2 mg/dL (ref 8.4–10.5)
Chloride: 107 mEq/L (ref 96–112)
GFR, Est African American: >89 mL/min
GFR, Est Non African American: >89 mL/min
Sodium: 140 mEq/L (ref 135–145)
Total Protein: 6.9 g/dL (ref 6.0–8.3)

## 2012-02-08 ENCOUNTER — Encounter: Payer: Self-pay | Admitting: *Deleted

## 2012-06-28 ENCOUNTER — Ambulatory Visit (INDEPENDENT_AMBULATORY_CARE_PROVIDER_SITE_OTHER): Payer: BC Managed Care – PPO | Admitting: Family Medicine

## 2012-06-28 ENCOUNTER — Encounter: Payer: Self-pay | Admitting: Family Medicine

## 2012-06-28 VITALS — BP 119/63 | HR 82 | Ht 66.0 in | Wt 173.0 lb

## 2012-06-28 DIAGNOSIS — N912 Amenorrhea, unspecified: Secondary | ICD-10-CM

## 2012-06-28 DIAGNOSIS — Z309 Encounter for contraceptive management, unspecified: Secondary | ICD-10-CM

## 2012-06-28 DIAGNOSIS — Z3201 Encounter for pregnancy test, result positive: Secondary | ICD-10-CM

## 2012-06-28 MED ORDER — PRENATAL VITAMINS PLUS 27-1 MG PO TABS: 1.0000 | ORAL_TABLET | Freq: Every day | ORAL | Status: DC

## 2012-06-28 NOTE — Patient Instructions (Addendum)
ABCs of Pregnancy  A  Antepartum care is very important. Be sure you see your doctor and get prenatal care as soon as you think you are pregnant. At this time, you will be tested for infection, genetic abnormalities and potential problems with you and the pregnancy. This is the time to discuss diet, exercise, work, medications, labor, pain medication during labor and the possibility of a cesarean delivery. Ask any questions that may concern you. It is important to see your doctor regularly throughout your pregnancy. Avoid exposure to toxic substances and chemicals - such as cleaning solvents, lead and mercury, some insecticides, and paint. Pregnant women should avoid exposure to paint fumes, and fumes that cause you to feel ill, dizzy or faint. When possible, it is a good idea to have a pre-pregnancy consultation with your caregiver to begin some important recommendations your caregiver suggests such as, taking folic acid, exercising, quitting smoking, avoiding alcoholic beverages, etc.  B  Breastfeeding is the healthiest choice for both you and your baby. It has many nutritional benefits for the baby and health benefits for the mother. It also creates a very tight and loving bond between the baby and mother. Talk to your doctor, your family and friends, and your employer about how you choose to feed your baby and how they can support you in your decision. Not all birth defects can be prevented, but a woman can take actions that may increase her chance of having a healthy baby. Many birth defects happen very early in pregnancy, sometimes before a woman even knows she is pregnant. Birth defects or abnormalities of any child in your or the father's family should be discussed with your caregiver. Get a good support bra as your breast size changes. Wear it especially when you exercise and when nursing.   C  Celebrate the news of your pregnancy with the your spouse/father and family. Childbirth classes are helpful to  take for you and the spouse/father because it helps to understand what happens during the pregnancy, labor and delivery. Cesarean delivery should be discussed with your doctor so you are prepared for that possibility. The pros and cons of circumcision if it is a boy, should be discussed with your pediatrician. Cigarette smoking during pregnancy can result in low birth weight babies. It has been associated with infertility, miscarriages, tubal pregnancies, infant death (mortality) and poor health (morbidity) in childhood. Additionally, cigarette smoking may cause long-term learning disabilities. If you smoke, you should try to quit before getting pregnant and not smoke during the pregnancy. Secondary smoke may also harm a mother and her developing baby. It is a good idea to ask people to stop smoking around you during your pregnancy and after the baby is born. Extra calcium is necessary when you are pregnant and is found in your prenatal vitamin, in dairy products, green leafy vegetables and in calcium supplements.  D  A healthy diet according to your current weight and height, along with vitamins and mineral supplements should be discussed with your caregiver. Domestic abuse or violence should be made known to your doctor right away to get the situation corrected. Drink more water when you exercise to keep hydrated. Discomfort of your back and legs usually develops and progresses from the middle of the second trimester through to delivery of the baby. This is because of the enlarging baby and uterus, which may also affect your balance. Do not take illegal drugs. Illegal drugs can seriously harm the baby and you. Drink extra   fluids (water is best) throughout pregnancy to help your body keep up with the increases in your blood volume. Drink at least 6 to 8 glasses of water, fruit juice, or milk each day. A good way to know you are drinking enough fluid is when your urine looks almost like clear water or is very light  yellow.   E  Eat healthy to get the nutrients you and your unborn baby need. Your meals should include the five basic food groups. Exercise (30 minutes of light to moderate exercise a day) is important and encouraged during pregnancy, if there are no medical problems or problems with the pregnancy. Exercise that causes discomfort or dizziness should be stopped and reported to your caregiver. Emotions during pregnancy can change from being ecstatic to depression and should be understood by you, your partner and your family.  F  Fetal screening with ultrasound, amniocentesis and monitoring during pregnancy and labor is common and sometimes necessary. Take 400 micrograms of folic acid daily both before, when possible, and during the first few months of pregnancy to reduce the risk of birth defects of the brain and spine. All women who could possibly become pregnant should take a vitamin with folic acid, every day. It is also important to eat a healthy diet with fortified foods (enriched grain products, including cereals, rice, breads, and pastas) and foods with natural sources of folate (orange juice, green leafy vegetables, beans, peanuts, broccoli, asparagus, peas, and lentils). The father should be involved with all aspects of the pregnancy including, the prenatal care, childbirth classes, labor, delivery, and postpartum time. Fathers may also have emotional concerns about being a father, financial needs, and raising a family.  G  Genetic testing should be done appropriately. It is important to know your family and the father's history. If there have been problems with pregnancies or birth defects in your family, report these to your doctor. Also, genetic counselors can talk with you about the information you might need in making decisions about having a family. You can call a major medical center in your area for help in finding a board-certified genetic counselor. Genetic testing and counseling should be done  before pregnancy when possible, especially if there is a history of problems in the mother's or father's family. Certain ethnic backgrounds are more at risk for genetic defects.  H  Get familiar with the hospital where you will be having your baby. Get to know how long it takes to get there, the labor and delivery area, and the hospital procedures. Be sure your medical insurance is accepted there. Get your home ready for the baby including, clothes, the baby's room (when possible), furniture and car seat. Hand washing is important throughout the day, especially after handling raw meat and poultry, changing the baby's diaper or using the bathroom. This can help prevent the spread of many bacteria and viruses that cause infection. Your hair may become dry and thinner, but will return to normal a few weeks after the baby is born. Heartburn is a common problem that can be treated by taking antacids recommended by your caregiver, eating smaller meals 5 or 6 times a day, not drinking liquids when eating, drinking between meals and raising the head of your bed 2 to 3 inches.  I  Insurance to cover you, the baby, doctor and hospital should be reviewed so that you will be prepared to pay any costs not covered by your insurance plan. If you do not have medical insurance,   there are usually clinics and services available for you in your community. Take 30 milligrams of iron during your pregnancy as prescribed by your doctor to reduce the risk of low red blood cells (anemia) later in pregnancy. All women of childbearing age should eat a diet rich in iron.  J  There should be a joint effort for the mother, father and any other children to adapt to the pregnancy financially, emotionally, and psychologically during the pregnancy. Join a support group for moms-to-be. Or, join a class on parenting or childbirth. Have the family participate when possible.  K  Know your limits. Let your caregiver know if you experience any of the  following:   · Pain of any kind.  · Strong cramps.  · You develop a lot of weight in a short period of time (5 pounds in 3 to 5 days).  · Vaginal bleeding, leaking of amniotic fluid.  · Headache, vision problems.  · Dizziness, fainting, shortness of breath.  · Chest pain.  · Fever of 102° F (38.9° C) or higher.  · Gush of clear fluid from your vagina.  · Painful urination.  · Domestic violence.  · Irregular heartbeat (palpitations).  · Rapid beating of the heart (tachycardia).  · Constant feeling sick to your stomach (nauseous) and vomiting.  · Trouble walking, fluid retention (edema).  · Muscle weakness.  · If your baby has decreased activity.  · Persistent diarrhea.  · Abnormal vaginal discharge.  · Uterine contractions at 20-minute intervals.  · Back pain that travels down your leg.  L  Learn and practice that what you eat and drink should be in moderation and healthy for you and your baby. Legal drugs such as alcohol and caffeine are important issues for pregnant women. There is no safe amount of alcohol a woman can drink while pregnant. Fetal alcohol syndrome, a disorder characterized by growth retardation, facial abnormalities, and central nervous system dysfunction, is caused by a woman's use of alcohol during pregnancy. Caffeine, found in tea, coffee, soft drinks and chocolate, should also be limited. Be sure to read labels when trying to cut down on caffeine during pregnancy. More than 200 foods, beverages, and over-the-counter medications contain caffeine and have a high salt content! There are coffees and teas that do not contain caffeine.  M  Medical conditions such as diabetes, epilepsy, and high blood pressure should be treated and kept under control before pregnancy when possible, but especially during pregnancy. Ask your caregiver about any medications that may need to be changed or adjusted during pregnancy. If you are currently taking any medications, ask your caregiver if it is safe to take them  while you are pregnant or before getting pregnant when possible. Also, be sure to discuss any herbs or vitamins you are taking. They are medicines, too! Discuss with your doctor all medications, prescribed and over-the-counter, that you are taking. During your prenatal visit, discuss the medications your doctor may give you during labor and delivery.  N  Never be afraid to ask your doctor or caregiver questions about your health, the progress of the pregnancy, family problems, stressful situations, and recommendation for a pediatrician, if you do not have one. It is better to take all precautions and discuss any questions or concerns you may have during your office visits. It is a good idea to write down your questions before you visit the doctor.  O  Over-the-counter cough and cold remedies may contain alcohol or other ingredients that should   be avoided during pregnancy. Ask your caregiver about prescription, herbs or over-the-counter medications that you are taking or may consider taking while pregnant.   P  Physical activity during pregnancy can benefit both you and your baby by lessening discomfort and fatigue, providing a sense of well-being, and increasing the likelihood of early recovery after delivery. Light to moderate exercise during pregnancy strengthens the belly (abdominal) and back muscles. This helps improve posture. Practicing yoga, walking, swimming, and cycling on a stationary bicycle are usually safe exercises for pregnant women. Avoid scuba diving, exercise at high altitudes (over 3000 feet), skiing, horseback riding, contact sports, etc. Always check with your doctor before beginning any kind of exercise, especially during pregnancy and especially if you did not exercise before getting pregnant.  Q  Queasiness, stomach upset and morning sickness are common during pregnancy. Eating a couple of crackers or dry toast before getting out of bed. Foods that you normally love may make you feel sick to  your stomach. You may need to substitute other nutritious foods. Eating 5 or 6 small meals a day instead of 3 large ones may make you feel better. Do not drink with your meals, drink between meals. Questions that you have should be written down and asked during your prenatal visits.  R  Read about and make plans to baby-proof your home. There are important tips for making your home a safer environment for your baby. Review the tips and make your home safer for you and your baby. Read food labels regarding calories, salt and fat content in the food.  S  Saunas, hot tubs, and steam rooms should be avoided while you are pregnant. Excessive high heat may be harmful during your pregnancy. Your caregiver will screen and examine you for sexually transmitted diseases and genetic disorders during your prenatal visits. Learn the signs of labor. Sexual relations while pregnant is safe unless there is a medical or pregnancy problem and your caregiver advises against it.  T  Traveling long distances should be avoided especially in the third trimester of your pregnancy. If you do have to travel out of state, be sure to take a copy of your medical records and medical insurance plan with you. You should not travel long distances without seeing your doctor first. Most airlines will not allow you to travel after 36 weeks of pregnancy. Toxoplasmosis is an infection caused by a parasite that can seriously harm an unborn baby. Avoid eating undercooked meat and handling cat litter. Be sure to wear gloves when gardening. Tingling of the hands and fingers is not unusual and is due to fluid retention. This will go away after the baby is born.  U  Womb (uterus) size increases during the first trimester. Your kidneys will begin to function more efficiently. This may cause you to feel the need to urinate more often. You may also leak urine when sneezing, coughing or laughing. This is due to the growing uterus pressing against your bladder,  which lies directly in front of and slightly under the uterus during the first few months of pregnancy. If you experience burning along with frequency of urination or bloody urine, be sure to tell your doctor. The size of your uterus in the third trimester may cause a problem with your balance. It is advisable to maintain good posture and avoid wearing high heels during this time. An ultrasound of your baby may be necessary during your pregnancy and is safe for you and your baby.  V    Vaccinations are an important concern for pregnant women. Get needed vaccines before pregnancy. Center for Disease Control (www.cdc.gov) has clear guidelines for the use of vaccines during pregnancy. Review the list, be sure to discuss it with your doctor. Prenatal vitamins are helpful and healthy for you and the baby. Do not take extra vitamins except what is recommended. Taking too much of certain vitamins can cause overdose problems. Continuous vomiting should be reported to your caregiver. Varicose veins may appear especially if there is a family history of varicose veins. They should subside after the delivery of the baby. Support hose helps if there is leg discomfort.  W  Being overweight or underweight during pregnancy may cause problems. Try to get within 15 pounds of your ideal weight before pregnancy. Remember, pregnancy is not a time to be dieting! Do not stop eating or start skipping meals as your weight increases. Both you and your baby need the calories and nutrition you receive from a healthy diet. Be sure to consult with your doctor about your diet. There is a formula and diet plan available depending on whether you are overweight or underweight. Your caregiver or nutritionist can help and advise you if necessary.  X  Avoid X-rays. If you must have dental work or diagnostic tests, tell your dentist or physician that you are pregnant so that extra care can be taken. X-rays should only be taken when the risks of not taking  them outweigh the risk of taking them. If needed, only the minimum amount of radiation should be used. When X-rays are necessary, protective lead shields should be used to cover areas of the body that are not being X-rayed.  Y  Your baby loves you. Breastfeeding your baby creates a loving and very close bond between the two of you. Give your baby a healthy environment to live in while you are pregnant. Infants and children require constant care and guidance. Their health and safety should be carefully watched at all times. After the baby is born, rest or take a nap when the baby is sleeping.  Z  Get your ZZZs. Be sure to get plenty of rest. Resting on your side as often as possible, especially on your left side is advised. It provides the best circulation to your baby and helps reduce swelling. Try taking a nap for 30 to 45 minutes in the afternoon when possible. After the baby is born rest or take a nap when the baby is sleeping. Try elevating your feet for that amount of time when possible. It helps the circulation in your legs and helps reduce swelling.   Most information courtesy of the CDC.  Document Released: 04/03/2005 Document Revised: 06/26/2011 Document Reviewed: 12/16/2008  [employer]® Patient Information ©2013 [employer].

## 2012-06-28 NOTE — Progress Notes (Signed)
  Subjective:    Patient ID: [name], female    DOB: 09-Aug-1980, 31 y.o.   MRN: [id number]  HPI pt took pregnancy test on 3/3 (night) that was positive and on 3/4 (morning) that was also positive LMP was 05/10/12, after missing for her period. Some breast tenderness, but no nausea.  Not on a vitamin. Off her citalopram for 3 months.  No spotting.    Review of Systems     Objective:   Physical Exam  Constitutional: She is oriented to person, place, and time. She appears well-developed and well-nourished.  HENT:  Head: Normocephalic and atraumatic.  Cardiovascular: Normal rate, regular rhythm and normal heart sounds.   Pulmonary/Chest: Effort normal and breath sounds normal.  Neurological: She is alert and oriented to person, place, and time.  Skin: Skin is warm and dry.  Psychiatric: She has a normal mood and affect. Her behavior is normal.          Assessment & Plan:  Pregnancy. - Start PNV.  Due date is 02/14/13.  She is 6 weeks and 6 days. She is not on any medications that we do not need to stop anything. She said overall her mood has been really well controlled off of the citalopram. Unfortunately the baby's father is no longer involved. She did let him know and they have not spoken since. He is not interested in being a parent. She does report that she has very supportive family and in fact has a family member here today. She has felt some close friends as well.

## 2012-07-15 ENCOUNTER — Ambulatory Visit (INDEPENDENT_AMBULATORY_CARE_PROVIDER_SITE_OTHER): Payer: BC Managed Care – PPO | Admitting: Advanced Practice Midwife

## 2012-07-15 ENCOUNTER — Encounter: Payer: Self-pay | Admitting: Advanced Practice Midwife

## 2012-07-15 VITALS — BP 121/74 | Wt 172.0 lb

## 2012-07-15 DIAGNOSIS — O3680X0 Pregnancy with inconclusive fetal viability, not applicable or unspecified: Secondary | ICD-10-CM

## 2012-07-15 DIAGNOSIS — R8789 Other abnormal findings in specimens from female genital organs: Secondary | ICD-10-CM

## 2012-07-15 DIAGNOSIS — Z3480 Encounter for supervision of other normal pregnancy, unspecified trimester: Secondary | ICD-10-CM | POA: Insufficient documentation

## 2012-07-15 NOTE — Progress Notes (Signed)
Subjective:    [name] is a [id number] [redacted]w[redacted]d by unsure LMP being seen today for her first obstetrical visit.  Her obstetrical history is significant for hx of 3 NSVDs at term, largest baby 8lbs. Patient does intend to breast feed. Pregnancy history fully reviewed.  Discussed hx of anxiety.  Pt stopped taking Celexa 3 months prior to pregnancy and reports doing well without it.  FOB not currently involved but she has friends/family for support.  Discussed with pt risks of anxiety/depression during pregnancy and postpartum and encouraged her to use support systems, seek counseling as needed, and let providers know if she is having any problems.  Patient reports no complaints.  Filed Vitals:   07/15/12 0856  BP: 121/74  Weight: 172 lb (78.019 kg)    HISTORY: OB History   Grav Para Term Preterm Abortions TAB SAB Ect Mult Living   4 3 3       3      # Outc Date GA Lbr Len/2nd Wgt Sex Del Anes PTL Lv   1 TRM 1/02 [redacted]w[redacted]d  6lb5oz(2.863kg) F SVD EPI No Yes   2 TRM 12/02 [redacted]w[redacted]d  6lb2oz(2.778kg) M SVD None No Yes   3 TRM 4/05 [redacted]w[redacted]d  8lb2oz(3.685kg) F SVD None No Yes   4 CUR              Past Medical History  Diagnosis Date  . Chest pain   . H/O anxiety disorder   . Anemia   . Abnormal pap     cryo   Past Surgical History  Procedure Laterality Date  . Wisdom tooth extraction    . Cervical cryo     Family History  Problem Relation Age of Onset  . Diabetes Maternal Aunt   . Hyperlipidemia      family history  . Hypertension Mother   . Heart failure Mother   . Hypertension Father   . Cancer Maternal Aunt     breast     Exam    Uterus:     Pelvic Exam:    Perineum: Normal Perineum   Vulva: normal   Vagina:  normal mucosa   pH:    Cervix: no cervical motion tenderness, 0/long/high, firm, posterior   Adnexa: normal adnexa and no mass, fullness, tenderness   Bony Pelvis: average  System: Breast:  normal appearance, no masses or tenderness   Skin: normal coloration  and turgor, no rashes    Neurologic: oriented, normal, normal mood, gait normal; reflexes normal and symmetric   Extremities: normal strength, tone, and muscle mass, ROM of all joints is normal   HEENT neck supple with midline trachea and thyroid without masses   Mouth/Teeth mucous membranes moist, pharynx normal without lesions   Neck supple and no masses   Cardiovascular: regular rate and rhythm, no murmurs or gallops   Respiratory:  appears well, vitals normal, no respiratory distress, acyanotic, normal RR, ear and throat exam is normal, neck free of mass or lymphadenopathy, chest clear, no wheezing, crepitations, rhonchi, normal symmetric air entry   Abdomen: soft, non-tender; bowel sounds normal; no masses,  no organomegaly   Urinary: urethral meatus normal      Assessment:    Pregnancy: [id number] Patient Active Problem List  Diagnosis  . UNSPECIFIED ANEMIA  . ANXIETY DISORDER, GENERALIZED  . INSOMNIA  . FATIGUE  . HEADACHE  . PAP SMEAR, LGSIL, ABNORMAL  . Supervision of normal subsequent pregnancy        Plan:  Ultrasound done in office on 07/15/12 indicates ~6 week size with no FHR Reviewed with pt likely miscarriage vs early viable pregnancy Bleeding precautions given Pt to return to office in 1 week for viability scan Discussed options for treatment including expectant management, Cytotec, D&C.   Pt prefers to f/u with ultrasound, then may choose Cytotec for management. Will review again with pt on f/u visit. Pt reports no rhogam given in previous pregnancies, she is sure her blood type is positive. Labs not drawn today. 50% of 30 min visit spent on counseling and coordination of care.     LEFTWICH-KIRBY, Jaryiah Mehlman 07/15/2012

## 2012-07-15 NOTE — Progress Notes (Signed)
[id number]  Pt is here for initial prenatal visit which was unplanned but appears happy about the pregnancy.  Unsure whether FOB involved in care.  Bedside u/s showed IUP CRL 8.57mm, no FHT detected.  Pt to [facility] in 1 week for f/u u/s.

## 2012-07-16 LAB — GC/CHLAMYDIA PROBE AMP: CT Probe RNA: NEGATIVE

## 2012-07-17 ENCOUNTER — Telehealth: Payer: Self-pay | Admitting: *Deleted

## 2012-07-17 LAB — CULTURE, OB URINE: Colony Count: 30000

## 2012-07-17 NOTE — Telephone Encounter (Signed)
Pt called adv had ultrasound completed and found no heartbeat and has started some vaginal bleeding with cramping. I adv pt to report to [facility] for eval

## 2012-07-18 ENCOUNTER — Encounter (HOSPITAL_COMMUNITY): Payer: Self-pay | Admitting: *Deleted

## 2012-07-18 ENCOUNTER — Inpatient Hospital Stay (HOSPITAL_COMMUNITY): Payer: BC Managed Care – PPO

## 2012-07-18 ENCOUNTER — Inpatient Hospital Stay (HOSPITAL_COMMUNITY)
Admission: AD | Admit: 2012-07-18 | Discharge: 2012-07-18 | Disposition: A | Discharge status: Home / Self Care | Payer: BC Managed Care – PPO | Source: Ambulatory Visit | Attending: Obstetrics & Gynecology | Admitting: Obstetrics & Gynecology

## 2012-07-18 DIAGNOSIS — O039 Complete or unspecified spontaneous abortion without complication: Secondary | ICD-10-CM | POA: Insufficient documentation

## 2012-07-18 DIAGNOSIS — Z3480 Encounter for supervision of other normal pregnancy, unspecified trimester: Secondary | ICD-10-CM

## 2012-07-18 DIAGNOSIS — O26859 Spotting complicating pregnancy, unspecified trimester: Secondary | ICD-10-CM

## 2012-07-18 LAB — CBC
HCT: 32.4 % — ABNORMAL LOW (ref 36.0–46.0)
Hemoglobin: 10.4 g/dL — ABNORMAL LOW (ref 12.0–15.0)
MCH: 25.2 pg — ABNORMAL LOW (ref 26.0–34.0)
MCHC: 32.1 g/dL (ref 30.0–36.0)
RBC: 4.13 MIL/uL (ref 3.87–5.11)

## 2012-07-18 LAB — ABO/RH: ABO/RH(D): O POS

## 2012-07-18 NOTE — [facility] Note (Signed)
Patient states she went to the [facility] earlier today bleeding heavily. States she waited for a couple of hours but was not evaluated. States she passed a large clot with heavy bleeding.

## 2012-07-18 NOTE — MAU Provider Note (Signed)
History     CSN: [id number]  Arrival date and time: 07/18/12 1557   None     Chief Complaint  Patient presents with  . Vaginal Bleeding   HPI 31 y.o. [id number] at [redacted]w[redacted]d by LMP. Positive pregnancy test last week. Started bleeding lightly 2 days ago, heavier today. Has gone through several pads and passed one large clot that she thinks may have had tissue in it. Mild cramping.   LMP 05/13/12. Got ultrasound at [facility] in [place] on 07/15/12. Saw fetus but no heart activity at that time.   OB History   Grav Para Term Preterm Abortions TAB SAB Ect Mult Living   4 3 3       3      [id number]:  First pregnancy miscarried around same time. All other normal vaginal deliveries.  Past Medical History  Diagnosis Date  . Chest pain   . H/O anxiety disorder   . Anemia   . Abnormal pap     cryo    Past Surgical History  Procedure Laterality Date  . Wisdom tooth extraction    . Cervical cryo      Family History  Problem Relation Age of Onset  . Diabetes Maternal Aunt   . Hyperlipidemia      family history  . Hypertension Mother   . Heart failure Mother   . Hypertension Father   . Cancer Maternal Aunt     breast    History  Substance Use Topics  . Smoking status: Never Smoker   . Smokeless tobacco: Not on file  . Alcohol Use: No    Allergies: No Known Allergies  Prescriptions prior to admission  Medication Sig Dispense Refill  . ibuprofen (ADVIL,MOTRIN) 200 MG tablet Take 400 mg by mouth every 6 (six) hours as needed for pain.      . Prenatal Vit-Fe Fumarate-FA (PRENATAL VITAMINS PLUS) 27-1 MG TABS Take 1 tablet by mouth daily.  30 tablet  11    ROS  Negative for fever/chills, nausea, vomiting, dysuria, dizziness, lightheadedness, SOB  Physical Exam   Blood pressure 121/60, pulse 68, temperature 99.2 F (37.3 C), temperature source Oral, resp. rate 16, height 5\' 6"  (1.676 m), weight 78.019 kg (172 lb), last menstrual period  05/13/2012.  Physical Exam GEN: alert, oriented, no distress CV:  RRR, no murmur RESP:  CTAB ABD:  Soft, non-tender, normal bowel sounds, no guarding or rebound EXTREM:  Warm, well perfused, no edema  Results for orders placed during the hospital encounter of 07/18/12 (from the past 24 hour(s))  CBC     Status: Abnormal   Collection Time    07/18/12  4:50 PM      Result Value Range   WBC 13.2 (*) 4.0 - 10.5 K/uL   RBC 4.13  3.87 - 5.11 MIL/uL   Hemoglobin 10.4 (*) 12.0 - 15.0 g/dL   HCT 30.8 (*) 65.7 - 84.6 %   MCV 78.5  78.0 - 100.0 fL   MCH 25.2 (*) 26.0 - 34.0 pg   MCHC 32.1  30.0 - 36.0 g/dL   RDW 96.2  95.2 - 84.1 %   Platelets 274  150 - 400 K/uL  ABO/RH     Status: None   Collection Time    07/18/12  4:50 PM      Result Value Range   ABO/RH(D) O POS       MAU Course  Procedures  Ultrsound:    Intrauterine gestational sac: Negative  for intrauterine  gestational sac. There is a small irregular fluid collection in  the anterior uterus which may represent some blood. This is  collapsed and does not appear to represent a gestational sac.  Yolk sac: Negative  Embryo: Negative  Cardiac Activity: Negative  Heart Rate: Negative bpm  Maternal uterus/adnexae:  Corpus luteum cyst left. No free fluid   IMPRESSION:  Based on the report, there was a 6-week intrauterine pregnancy 3  days ago. This is no longer present compatible a spontaneous  abortion. Small amount of fluid in the uterus likely is blood.   Assessment and Plan  32 y.o. [id number] at [redacted]w[redacted]d with spontaneous abortion - Hemodynamically stable - Ibuprofen for pain - Return for heavy bleeding, fever/chills - F/u at [facility] in 2 weeks  Napoleon Form 07/18/2012, 4:43 PM

## 2012-07-18 NOTE — Progress Notes (Signed)
No tissue available for photo and touch.

## 2012-07-18 NOTE — MAU Note (Signed)
Patient states she has been seen at the [facility] office. States the last ultrasound showed the fetus did not have a heart beat but she was to return next Monday for a follow up ultrasound. States she has been having spotting since Monday with heavier bleeding today. Having slight abdominal cramping.

## 2012-07-22 NOTE — MAU Provider Note (Signed)
Attestation of Attending Supervision of Advanced Practitioner (CNM/NP)/fellow: Evaluation and management procedures were performed by the Advanced Practitioner under my supervision and collaboration.  I have reviewed the Advanced Practitioner's note and chart, and I agree with the management and plan.  HARRAWAY-SMITH, Melek Pownall 6:32 PM

## 2012-09-06 ENCOUNTER — Ambulatory Visit (INDEPENDENT_AMBULATORY_CARE_PROVIDER_SITE_OTHER): Payer: Self-pay | Admitting: Family Medicine

## 2012-09-06 ENCOUNTER — Encounter: Payer: Self-pay | Admitting: Family Medicine

## 2012-09-06 VITALS — BP 106/71 | HR 61 | Wt 167.0 lb

## 2012-09-06 DIAGNOSIS — M542 Cervicalgia: Secondary | ICD-10-CM

## 2012-09-06 DIAGNOSIS — R51 Headache: Secondary | ICD-10-CM

## 2012-09-06 DIAGNOSIS — G44309 Post-traumatic headache, unspecified, not intractable: Secondary | ICD-10-CM

## 2012-09-06 DIAGNOSIS — M546 Pain in thoracic spine: Secondary | ICD-10-CM

## 2012-09-06 MED ORDER — CYCLOBENZAPRINE HCL 10 MG PO TABS: 5.0000 mg | ORAL_TABLET | Freq: Three times a day (TID) | ORAL | Status: DC | PRN

## 2012-09-06 MED ORDER — DICLOFENAC SODIUM 1 % TD GEL: 2.0000 g | Freq: Four times a day (QID) | TRANSDERMAL | Status: DC

## 2012-09-06 NOTE — Patient Instructions (Signed)
Work on stretches Use your heating pad or heat wrap Can use Ibuprofen 600mg  up to 3 x a day. Take with food and water. dont have to take if your insurance covers the volteran gel.  Can use the muscle relaxer as well.  Can try the volteran gel if covered.
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
## 2013-03-24 ENCOUNTER — Other Ambulatory Visit: Payer: Self-pay | Admitting: Family Medicine

## 2013-03-24 ENCOUNTER — Ambulatory Visit (INDEPENDENT_AMBULATORY_CARE_PROVIDER_SITE_OTHER): Payer: BC Managed Care – PPO | Admitting: Family Medicine

## 2013-03-24 ENCOUNTER — Encounter: Payer: Self-pay | Admitting: Family Medicine

## 2013-03-24 VITALS — BP 124/85 | HR 59 | Temp 97.5°F | Ht 67.0 in | Wt 152.0 lb

## 2013-03-24 DIAGNOSIS — Z113 Encounter for screening for infections with a predominantly sexual mode of transmission: Secondary | ICD-10-CM

## 2013-03-24 DIAGNOSIS — Z111 Encounter for screening for respiratory tuberculosis: Secondary | ICD-10-CM

## 2013-03-24 DIAGNOSIS — Z23 Encounter for immunization: Secondary | ICD-10-CM

## 2013-03-24 DIAGNOSIS — Z0000 Encounter for general adult medical examination without abnormal findings: Secondary | ICD-10-CM

## 2013-03-24 NOTE — Patient Instructions (Signed)
Keep up a regular exercise program and make sure you are eating a healthy diet Try to eat 4 servings of dairy a day, or if you are lactose intolerant take a calcium with vitamin D daily.  Your vaccines are up to date.   

## 2013-03-24 NOTE — Addendum Note (Signed)
Addended by: Deno Etienne on: 03/24/2013 03:06 PM   Modules accepted: Orders

## 2013-03-24 NOTE — Progress Notes (Signed)
  Subjective:     [name] is a 32 y.o. female and is here for a comprehensive physical exam. The patient reports no problems.  History   Social History  . Marital Status: Single    Spouse Name: N/A    Number of Children: 3  . 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 Smoking status: Never Smoker   . Smokeless tobacco: Not on file  . Alcohol Use: No  . Drug Use: No  . Sexual Activity: Yes    Partners: Male     Comment: single, 3 kids, single mom, no regular exercise   Other Topics Concern  . Not on file   Social History Narrative   1-2 caffeinated drinks per day. No regular exercise.     Health Maintenance  Topic Date Due  . Influenza Vaccine  11/15/2013  . Pap Smear  01/25/2015  . Tetanus/tdap  10/05/2020    The following portions of the patient's history were reviewed and updated as appropriate: allergies, current medications, past family history, past medical history, past social history, past surgical history and problem list.  Review of Systems A comprehensive review of systems was negative.   Objective:    BP 124/85  Pulse 59  Temp(Src) 97.5 F (36.4 C)  Ht 5\' 7"  (1.702 m)  Wt 152 lb (68.947 kg)  BMI 23.80 kg/m2 General appearance: alert, cooperative and appears stated age Head: Normocephalic, without obvious abnormality, atraumatic Eyes: conj clear, EOMi, PEERLA Ears: normal TM's and external ear canals both ears Nose: Nares normal. Septum midline. Mucosa normal. No drainage or sinus tenderness. Throat: lips, mucosa, and tongue normal; teeth and gums normal Neck: no adenopathy, no carotid bruit, no JVD, supple, symmetrical, trachea midline and thyroid not enlarged, symmetric, no tenderness/mass/nodules Back: symmetric, no curvature. ROM normal. No CVA tenderness. Lungs: clear to auscultation bilaterally Breasts: normal appearance, no masses or tenderness Heart: regular rate and rhythm,  S1, S2 normal, no murmur, click, rub or gallop Abdomen: soft, non-tender; bowel sounds normal; no masses,  no organomegaly Pelvic:Not peformed.  Extremities: extremities normal, atraumatic, no cyanosis or edema Pulses: 2+ and symmetric Skin: Skin color, texture, turgor normal. No rashes or lesions Lymph nodes: Cervical, supraclavicular, and axillary nodes normal. Neurologic: Grossly normal    Assessment:    Healthy female exam.      Plan:     Keep up a regular exercise program and make sure you are eating a healthy diet Try to eat 4 servings of dairy a day, or if you are lactose intolerant take a calcium with vitamin D daily.  Your vaccines are up to date.  Repeat mammogram.   See After Visit Summary for Counseling Recommendations

## 2013-03-25 LAB — CBC
Hemoglobin: 10.5 g/dL — ABNORMAL LOW (ref 12.0–15.0)
MCH: 25 pg — ABNORMAL LOW (ref 26.0–34.0)
MCV: 75.7 fL — ABNORMAL LOW (ref 78.0–100.0)
RBC: 4.2 MIL/uL (ref 3.87–5.11)

## 2013-03-25 LAB — LIPID PANEL
Cholesterol: 184 mg/dL (ref 0–200)
HDL: 65 mg/dL (ref >39)
Triglycerides: 50 mg/dL (ref <150)
VLDL: 10 mg/dL (ref 0–40)

## 2013-03-25 LAB — COMPLETE METABOLIC PANEL WITH GFR
AST: 12 U/L (ref 0–37)
BUN: 15 mg/dL (ref 6–23)
Calcium: 9.3 mg/dL (ref 8.4–10.5)
Chloride: 107 mEq/L (ref 96–112)
Creat: 0.61 mg/dL (ref 0.50–1.10)
GFR, Est African American: >89 mL/min
Total Bilirubin: 0.6 mg/dL (ref 0.3–1.2)

## 2013-07-01 ENCOUNTER — Ambulatory Visit (INDEPENDENT_AMBULATORY_CARE_PROVIDER_SITE_OTHER): Payer: Self-pay | Admitting: Family Medicine

## 2013-07-01 ENCOUNTER — Encounter: Payer: Self-pay | Admitting: Family Medicine

## 2013-07-01 VITALS — BP 115/66 | HR 60 | Temp 98.1°F | Ht 67.0 in | Wt 153.0 lb

## 2013-07-01 DIAGNOSIS — Q767 Congenital malformation of sternum: Secondary | ICD-10-CM

## 2013-07-01 DIAGNOSIS — Q766 Other congenital malformations of ribs: Secondary | ICD-10-CM

## 2013-07-01 DIAGNOSIS — Q678 Other congenital deformities of chest: Secondary | ICD-10-CM

## 2013-07-01 NOTE — Progress Notes (Signed)
   Subjective:    Patient ID: [name], female    DOB: 07/19/1980, 33 y.o.   MRN: [id number]  HPI Noticed a raised area on the left upper side of her chest. She says even someone at work noticed that it seems to be sticking out a little further. She does not have any pain. It's not red. It does not bother her in any way. She thinks it may have actually been that way before but just was 100% sure and wanted to make sure that everything was normal.   Review of Systems     Objective:   Physical Exam  Constitutional: She is oriented to person, place, and time. She appears well-developed and well-nourished.  HENT:  Head: Normocephalic and atraumatic.  Musculoskeletal:  Second third and fourth ribs in the left upper chest are somewhat prominent and curved compared to the right upper side of the chest. They are nontender. Do not feel deformed in any way. No overlying erythema or rash.  Neurological: She is alert and oriented to person, place, and time.  Skin: Skin is warm and dry.  Psychiatric: She has a normal mood and affect. Her behavior is normal.          Assessment & Plan:  Asymmetry of chest wall-she's has more prominent curved ribs on the left upper side of her chest compared to her right. I did examine her back to make sure from his spinal scoliosis and I do not see any. Reassured her that this can be a very normal finding. Certainly if she notices any change. If she feels like the shape of the bones themselves are changing, becoming more protuberant, or if she notices any discomfort or rash then to call us back immediately and we can always get a rib film.

## 2014-02-16 ENCOUNTER — Encounter: Payer: Self-pay | Admitting: Family Medicine

## 2014-03-27 ENCOUNTER — Telehealth: Payer: Self-pay | Admitting: Physician Assistant

## 2014-03-27 ENCOUNTER — Ambulatory Visit (INDEPENDENT_AMBULATORY_CARE_PROVIDER_SITE_OTHER): Payer: Self-pay | Admitting: Physician Assistant

## 2014-03-27 ENCOUNTER — Encounter: Payer: Self-pay | Admitting: Physician Assistant

## 2014-03-27 VITALS — BP 136/70 | HR 67 | Ht 66.0 in | Wt 165.0 lb

## 2014-03-27 DIAGNOSIS — Z13220 Encounter for screening for lipoid disorders: Secondary | ICD-10-CM

## 2014-03-27 DIAGNOSIS — D509 Iron deficiency anemia, unspecified: Secondary | ICD-10-CM

## 2014-03-27 DIAGNOSIS — Z0000 Encounter for general adult medical examination without abnormal findings: Secondary | ICD-10-CM

## 2014-03-27 DIAGNOSIS — Z7251 High risk heterosexual behavior: Secondary | ICD-10-CM

## 2014-03-27 DIAGNOSIS — Z131 Encounter for screening for diabetes mellitus: Secondary | ICD-10-CM

## 2014-03-27 NOTE — Telephone Encounter (Signed)
inadvertnely forgot to collect urine for GC/Chlamydia. Make sure to remind patient when come in for labs to also give urine sample.

## 2014-03-27 NOTE — Telephone Encounter (Signed)
Pt notified.  Order faxed to lab.

## 2014-03-27 NOTE — Patient Instructions (Signed)

## 2014-03-29 NOTE — Progress Notes (Signed)
  Subjective:     [name] is a 33 y.o. female and is here for a comprehensive physical exam. The patient reports no problems.  History   Social History  . Marital Status: Single    Spouse Name: N/A    Number of Children: 3  . 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 Smoking status: Never Smoker   . Smokeless tobacco: Not on file  . Alcohol Use: No  . Drug Use: No  . Sexual Activity:    Partners: Male     Comment: single, 3 kids, single mom, no regular exercise   Other Topics Concern  . Not on file   Social History Narrative   1-2 caffeinated drinks per day. No regular exercise.     Health Maintenance  Topic Date Due  . INFLUENZA VACCINE  11/16/2014  . PAP SMEAR  01/25/2015  . TETANUS/TDAP  10/05/2020    The following portions of the patient's history were reviewed and updated as appropriate: allergies, current medications, past family history, past medical history, past social history, past surgical history and problem list.  Review of Systems A comprehensive review of systems was negative.   Objective:    BP 136/70 mmHg  Pulse 67  Ht 5\' 6"  (1.676 m)  Wt 165 lb (74.844 kg)  BMI 26.64 kg/m2  LMP 03/12/2014 General appearance: alert, cooperative and appears stated age Head: Normocephalic, without obvious abnormality, atraumatic Eyes: conjunctivae/corneas clear. PERRL, EOM's intact. Fundi benign. Ears: normal TM's and external ear canals both ears Nose: Nares normal. Septum midline. Mucosa normal. No drainage or sinus tenderness. Throat: lips, mucosa, and tongue normal; teeth and gums normal Neck: no adenopathy, no carotid bruit, no JVD, supple, symmetrical, trachea midline and thyroid not enlarged, symmetric, no tenderness/mass/nodules Back: symmetric, no curvature. ROM normal. No CVA tenderness. Lungs: clear to auscultation bilaterally Breasts: normal appearance, no masses or  tenderness Heart: regular rate and rhythm, S1, S2 normal, no murmur, click, rub or gallop Abdomen: soft, non-tender; bowel sounds normal; no masses,  no organomegaly Extremities: extremities normal, atraumatic, no cyanosis or edema Pulses: 2+ and symmetric Skin: Skin color, texture, turgor normal. No rashes or lesions Lymph nodes: Cervical, supraclavicular, and axillary nodes normal. Neurologic: Grossly normal    Assessment:    Healthy female exam.      Plan:    CPE- pap up to date. Vaccines up to date. Will get STD panel. Will get screening labs. Discussed calcium 1200mg  and vitamin D 800 units. Encouraged regular exercise at least 150 minutes weekly.   IDA- will check ferritin and iron panel.  See After Visit Summary for Counseling Recommendations

## 2014-03-31 LAB — CBC WITH DIFFERENTIAL/PLATELET
BASOS PCT: 0 % (ref 0–1)
Basophils Absolute: 0 10*3/uL (ref 0.0–0.1)
EOS ABS: 0.1 10*3/uL (ref 0.0–0.7)
EOS PCT: 1 % (ref 0–5)
HCT: 36 % (ref 36.0–46.0)
HEMOGLOBIN: 11.5 g/dL — AB (ref 12.0–15.0)
LYMPHS ABS: 2.8 10*3/uL (ref 0.7–4.0)
Lymphocytes Relative: 38 % (ref 12–46)
MCH: 25.7 pg — AB (ref 26.0–34.0)
MCHC: 31.9 g/dL (ref 30.0–36.0)
MCV: 80.5 fL (ref 78.0–100.0)
MONOS PCT: 7 % (ref 3–12)
MPV: 10.8 fL (ref 9.4–12.4)
Monocytes Absolute: 0.5 10*3/uL (ref 0.1–1.0)
NEUTROS PCT: 54 % (ref 43–77)
Neutro Abs: 3.9 10*3/uL (ref 1.7–7.7)
Platelets: 276 10*3/uL (ref 150–400)
RBC: 4.47 MIL/uL (ref 3.87–5.11)
RDW: 14.3 % (ref 11.5–15.5)
WBC: 7.3 10*3/uL (ref 4.0–10.5)

## 2014-03-31 LAB — LIPID PANEL
Cholesterol: 195 mg/dL (ref 0–200)
HDL: 71 mg/dL (ref >39)
LDL CALC: 113 mg/dL — AB (ref 0–99)
Total CHOL/HDL Ratio: 2.7 Ratio
Triglycerides: 57 mg/dL (ref <150)
VLDL: 11 mg/dL (ref 0–40)

## 2014-03-31 LAB — COMPLETE METABOLIC PANEL WITH GFR
ALBUMIN: 4.3 g/dL (ref 3.5–5.2)
AST: 13 U/L (ref 0–37)
Alkaline Phosphatase: 44 U/L (ref 39–117)
BUN: 15 mg/dL (ref 6–23)
CALCIUM: 9.2 mg/dL (ref 8.4–10.5)
CHLORIDE: 106 meq/L (ref 96–112)
CO2: 24 meq/L (ref 19–32)
Creat: 0.51 mg/dL (ref 0.50–1.10)
GLUCOSE: 82 mg/dL (ref 70–99)
POTASSIUM: 3.8 meq/L (ref 3.5–5.3)
SODIUM: 140 meq/L (ref 135–145)
TOTAL PROTEIN: 7.3 g/dL (ref 6.0–8.3)
Total Bilirubin: 1.1 mg/dL (ref 0.2–1.2)

## 2014-03-31 LAB — FERRITIN: FERRITIN: 33 ng/mL (ref 10–291)

## 2014-03-31 LAB — RPR

## 2014-03-31 LAB — HIV ANTIBODY (ROUTINE TESTING W REFLEX): HIV 1&2 Ab, 4th Generation: NONREACTIVE

## 2014-03-31 LAB — TSH: TSH: 1.151 u[IU]/mL (ref 0.350–4.500)

## 2014-04-16 ENCOUNTER — Ambulatory Visit: Payer: Self-pay | Admitting: Family Medicine

## 2015-02-15 ENCOUNTER — Encounter: Payer: Self-pay | Admitting: Family Medicine

## 2015-02-15 ENCOUNTER — Ambulatory Visit (INDEPENDENT_AMBULATORY_CARE_PROVIDER_SITE_OTHER): Payer: Commercial Managed Care - PPO | Admitting: Family Medicine

## 2015-02-15 ENCOUNTER — Other Ambulatory Visit (HOSPITAL_COMMUNITY)
Admission: RE | Admit: 2015-02-15 | Discharge: 2015-02-15 | Disposition: A | Discharge status: Home / Self Care | Payer: Commercial Managed Care - PPO | Source: Ambulatory Visit | Attending: Family Medicine | Admitting: Family Medicine

## 2015-02-15 VITALS — BP 127/71 | HR 58 | Ht 66.0 in | Wt 182.0 lb

## 2015-02-15 DIAGNOSIS — Z124 Encounter for screening for malignant neoplasm of cervix: Secondary | ICD-10-CM | POA: Diagnosis not present

## 2015-02-15 DIAGNOSIS — Z1151 Encounter for screening for human papillomavirus (HPV): Secondary | ICD-10-CM | POA: Insufficient documentation

## 2015-02-15 DIAGNOSIS — Z01419 Encounter for gynecological examination (general) (routine) without abnormal findings: Secondary | ICD-10-CM | POA: Diagnosis present

## 2015-02-15 DIAGNOSIS — Z0000 Encounter for general adult medical examination without abnormal findings: Secondary | ICD-10-CM

## 2015-02-15 DIAGNOSIS — D509 Iron deficiency anemia, unspecified: Secondary | ICD-10-CM | POA: Diagnosis not present

## 2015-02-15 LAB — CBC WITH DIFFERENTIAL/PLATELET
BASOS ABS: 0 10*3/uL (ref 0.0–0.1)
Basophils Relative: 0 % (ref 0–1)
EOS ABS: 0.1 10*3/uL (ref 0.0–0.7)
EOS PCT: 1 % (ref 0–5)
HEMATOCRIT: 32.9 % — AB (ref 36.0–46.0)
Hemoglobin: 11.1 g/dL — ABNORMAL LOW (ref 12.0–15.0)
LYMPHS ABS: 2.6 10*3/uL (ref 0.7–4.0)
LYMPHS PCT: 30 % (ref 12–46)
MCH: 26 pg (ref 26.0–34.0)
MCHC: 33.7 g/dL (ref 30.0–36.0)
MCV: 77 fL — AB (ref 78.0–100.0)
MONO ABS: 0.6 10*3/uL (ref 0.1–1.0)
MONOS PCT: 7 % (ref 3–12)
MPV: 10.5 fL (ref 8.6–12.4)
Neutro Abs: 5.5 10*3/uL (ref 1.7–7.7)
Neutrophils Relative %: 62 % (ref 43–77)
PLATELETS: 299 10*3/uL (ref 150–400)
RBC: 4.27 MIL/uL (ref 3.87–5.11)
RDW: 14.5 % (ref 11.5–15.5)
WBC: 8.8 10*3/uL (ref 4.0–10.5)

## 2015-02-15 NOTE — Patient Instructions (Signed)
Keep up a regular exercise program and make sure you are eating a healthy diet Try to eat 4 servings of dairy a day, or if you are lactose intolerant take a calcium with vitamin D daily.  Your vaccines are up to date.   

## 2015-02-15 NOTE — Progress Notes (Addendum)
  Subjective:     [name] is a 34 y.o. female and is here for a comprehensive physical exam. The patient reports no problems.   Social History   Social History  . Marital Status: Single    Spouse Name: N/A  . Number of Children: 3  . 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 Smoking status: Never Smoker   . Smokeless tobacco: Not on file  . Alcohol Use: No  . Drug Use: No  . Sexual Activity:    Partners: Male     Comment: single, 3 kids, single mom, no regular exercise   Other Topics Concern  . Not on file   Social History Narrative   1-2 caffeinated drinks per day. No regular exercise.     Health Maintenance  Topic Date Due  . PAP SMEAR  01/25/2015  . INFLUENZA VACCINE  11/16/2015  . TETANUS/TDAP  10/05/2020  . HIV Screening  Completed    The following portions of the patient's history were reviewed and updated as appropriate: allergies, current medications, past family history, past medical history, past social history, past surgical history and problem list.  Review of Systems A comprehensive review of systems was negative.   Objective:    BP 127/71 mmHg  Pulse 58  Ht 5\' 6"  (1.676 m)  Wt 182 lb (82.555 kg)  BMI 29.39 kg/m2  LMP 02/11/2015 (Exact Date) General appearance: alert, cooperative and appears stated age Head: Normocephalic, without obvious abnormality, atraumatic Eyes: conj clear, EOMI, PEERLA Ears: normal TM's and external ear canals both ears Nose: Nares normal. Septum midline. Mucosa normal. No drainage or sinus tenderness. Throat: lips, mucosa, and tongue normal; teeth and gums normal Neck: no adenopathy, no carotid bruit, no JVD, supple, symmetrical, trachea midline and thyroid not enlarged, symmetric, no tenderness/mass/nodules Back: symmetric, no curvature. ROM normal. No CVA tenderness. Lungs: clear to auscultation bilaterally Breasts: normal appearance, no masses or  tenderness, nipples inverted bilaterallly.  Heart: regular rate and rhythm, S1, S2 normal, no murmur, click, rub or gallop Abdomen: soft, non-tender; bowel sounds normal; no masses,  no organomegaly Pelvic: cervix normal in appearance, external genitalia normal, no adnexal masses or tenderness, no cervical motion tenderness, rectovaginal septum normal, uterus normal size, shape, and consistency and vagina normal without discharge Extremities: extremities normal, atraumatic, no cyanosis or edema Pulses: 2+ and symmetric Skin: Skin color, texture, turgor normal. No rashes or lesions Lymph nodes: Cervical, supraclavicular, and axillary nodes normal. Neurologic: Alert and oriented X 3, normal strength and tone. Normal symmetric reflexes. Normal coordination and gait    Assessment:    Healthy female exam.      Plan:     See After Visit Summary for Counseling Recommendations  Keep up a regular exercise program and make sure you are eating a healthy diet Try to eat 4 servings of dairy a day, or if you are lactose intolerant take a calcium with vitamin D daily.  Your vaccines are up to date.  Due from screening labs.    Iron deficiency anemia-due to recheck ferritin. Next  Prefers STD testing.

## 2015-02-16 LAB — FERRITIN: Ferritin: 39 ng/mL (ref 10–291)

## 2015-02-16 LAB — TSH: TSH: 1.788 u[IU]/mL (ref 0.350–4.500)

## 2015-02-16 LAB — COMPLETE METABOLIC PANEL WITH GFR
ALBUMIN: 4.2 g/dL (ref 3.6–5.1)
ALK PHOS: 53 U/L (ref 33–115)
ALT: 11 U/L (ref 6–29)
AST: 18 U/L (ref 10–30)
BUN: 17 mg/dL (ref 7–25)
CALCIUM: 8.8 mg/dL (ref 8.6–10.2)
CHLORIDE: 104 mmol/L (ref 98–110)
CO2: 23 mmol/L (ref 20–31)
Creat: 0.59 mg/dL (ref 0.50–1.10)
Glucose, Bld: 82 mg/dL (ref 65–99)
POTASSIUM: 3.9 mmol/L (ref 3.5–5.3)
Sodium: 139 mmol/L (ref 135–146)
Total Bilirubin: 0.5 mg/dL (ref 0.2–1.2)
Total Protein: 7.3 g/dL (ref 6.1–8.1)

## 2015-02-16 LAB — LIPID PANEL
CHOL/HDL RATIO: 2.9 ratio (ref <=5.0)
CHOLESTEROL: 222 mg/dL — AB (ref 125–200)
HDL: 76 mg/dL (ref >=46)
LDL Cholesterol: 133 mg/dL — ABNORMAL HIGH (ref <130)
TRIGLYCERIDES: 63 mg/dL (ref <150)
VLDL: 13 mg/dL (ref <30)

## 2015-02-16 LAB — HIV ANTIBODY (ROUTINE TESTING W REFLEX): HIV 1&2 Ab, 4th Generation: NONREACTIVE

## 2015-02-16 LAB — RPR

## 2015-02-18 LAB — CYTOLOGY - PAP

## 2015-04-01 ENCOUNTER — Encounter: Payer: Self-pay | Admitting: Family Medicine

## 2015-04-01 ENCOUNTER — Ambulatory Visit (INDEPENDENT_AMBULATORY_CARE_PROVIDER_SITE_OTHER): Payer: Commercial Managed Care - PPO | Admitting: Family Medicine

## 2015-04-01 VITALS — BP 135/69 | HR 71 | Ht 66.0 in | Wt 183.0 lb

## 2015-04-01 DIAGNOSIS — Z3481 Encounter for supervision of other normal pregnancy, first trimester: Secondary | ICD-10-CM | POA: Diagnosis not present

## 2015-04-01 DIAGNOSIS — N912 Amenorrhea, unspecified: Secondary | ICD-10-CM

## 2015-04-01 DIAGNOSIS — Z3491 Encounter for supervision of normal pregnancy, unspecified, first trimester: Secondary | ICD-10-CM

## 2015-04-01 NOTE — Progress Notes (Signed)
   Subjective:    Patient ID: [name], female    DOB: 02/25/1981, 34 y.o.   MRN: [id number]  HPI  patient is a 34-year-ld female with 4 other children comes in today to confirm her pregnancy. She had 4 positive tests at home. The first day of her last menstrual cycle was October 24. Thatwould place her estimated due date of July 31.  She is 7 weeks and 3 days today. Has started an OTC  PNV.   Her boyfriend is here with her today. She has 3 children already.    Review of Systems     Objective:   Physical Exam  Constitutional: She is oriented to person, place, and time. She appears well-developed and well-nourished.  HENT:  Head: Normocephalic and atraumatic.  Eyes: Conjunctivae and EOM are normal.  Cardiovascular: Normal rate.   Pulmonary/Chest: Effort normal.  Neurological: She is alert and oriented to person, place, and time.  Skin: Skin is dry. No pallor.  Psychiatric: She has a normal mood and affect. Her behavior is normal.  Vitals reviewed.         Assessment & Plan:   pregnancy-congratulated her today. She 7 weeks and 3 days today. Also discussed her estimateddue dae Since she's ad 4 posiive home regnancy testwe will just get the serum test today just for confiration and to make sre that the dateseem somewhat accurate. We discussed where she would like t deliver the babyand she [facility] [place] past our offic fo work. Also discussed making sure that shes taking a prenatal. She says he started started 1. Recommend that she avoid anything like Aleve and ibuprofen etc. Remove this from her medicatio list.  Time spent 20 min, >50% spent counseling about pregnancy.

## 2015-04-02 LAB — HCG, QUANTITATIVE, PREGNANCY: HCG, BETA CHAIN, QUANT, S: 28679.4 m[IU]/mL — AB

## 2015-04-15 ENCOUNTER — Encounter: Payer: Self-pay | Admitting: Family Medicine

## 2015-04-15 ENCOUNTER — Ambulatory Visit (INDEPENDENT_AMBULATORY_CARE_PROVIDER_SITE_OTHER): Payer: Commercial Managed Care - PPO | Admitting: Family Medicine

## 2015-04-15 VITALS — BP 114/66 | HR 69 | Wt 183.0 lb

## 2015-04-15 DIAGNOSIS — Z113 Encounter for screening for infections with a predominantly sexual mode of transmission: Secondary | ICD-10-CM | POA: Diagnosis not present

## 2015-04-15 DIAGNOSIS — Z3481 Encounter for supervision of other normal pregnancy, first trimester: Secondary | ICD-10-CM

## 2015-04-15 DIAGNOSIS — Z3490 Encounter for supervision of normal pregnancy, unspecified, unspecified trimester: Secondary | ICD-10-CM | POA: Insufficient documentation

## 2015-04-15 DIAGNOSIS — Z3491 Encounter for supervision of normal pregnancy, unspecified, first trimester: Secondary | ICD-10-CM

## 2015-04-15 NOTE — Progress Notes (Signed)
Bedside U/S shows IUP with CRL of 21.328mm and GA is [id number]  FHT 173 BPM

## 2015-04-15 NOTE — Patient Instructions (Signed)
 First Trimester of Pregnancy The first trimester of pregnancy is from week 1 until the end of week 12 (months 1 through 3). A week after a sperm fertilizes an egg, the egg will implant on the wall of the uterus. This embryo will begin to develop into a baby. Genes from you and your partner are forming the baby. The female genes determine whether the baby is a boy or a girl. At 6-8 weeks, the eyes and face are formed, and the heartbeat can be seen on ultrasound. At the end of 12 weeks, all the baby's organs are formed.  Now that you are pregnant, you will want to do everything you can to have a healthy baby. Two of the most important things are to get good prenatal care and to follow your health care provider's instructions. Prenatal care is all the medical care you receive before the baby's birth. This care will help prevent, find, and treat any problems during the pregnancy and childbirth. BODY CHANGES Your body goes through many changes during pregnancy. The changes vary from woman to woman.   You may gain or lose a couple of pounds at first.  You may feel sick to your stomach (nauseous) and throw up (vomit). If the vomiting is uncontrollable, call your health care provider.  You may tire easily.  You may develop headaches that can be relieved by medicines approved by your health care provider.  You may urinate more often. Painful urination may mean you have a bladder infection.  You may develop heartburn as a result of your pregnancy.  You may develop constipation because certain hormones are causing the muscles that push waste through your intestines to slow down.  You may develop hemorrhoids or swollen, bulging veins (varicose veins).  Your breasts may begin to grow larger and become tender. Your nipples may stick out more, and the tissue that surrounds them (areola) may become darker.  Your gums may bleed and may be sensitive to brushing and flossing.  Dark spots or blotches  (chloasma, mask of pregnancy) may develop on your face. This will likely fade after the baby is born.  Your menstrual periods will stop.  You may have a loss of appetite.  You may develop cravings for certain kinds of food.  You may have changes in your emotions from day to day, such as being excited to be pregnant or being concerned that something may go wrong with the pregnancy and baby.  You may have more vivid and strange dreams.  You may have changes in your hair. These can include thickening of your hair, rapid growth, and changes in texture. Some women also have hair loss during or after pregnancy, or hair that feels dry or thin. Your hair will most likely return to normal after your baby is born. WHAT TO EXPECT AT YOUR PRENATAL VISITS During a routine prenatal visit:  You will be weighed to make sure you and the baby are growing normally.  Your blood pressure will be taken.  Your abdomen will be measured to track your baby's growth.  The fetal heartbeat will be listened to starting around week 10 or 12 of your pregnancy.  Test results from any previous visits will be discussed. Your health care provider may ask you:  How you are feeling.  If you are feeling the baby move.  If you have had any abnormal symptoms, such as leaking fluid, bleeding, severe headaches, or abdominal cramping.  If you are using any tobacco   products, including cigarettes, chewing tobacco, and electronic cigarettes.  If you have any questions. Other tests that may be performed during your first trimester include:  Blood tests to find your blood type and to check for the presence of any previous infections. They will also be used to check for low iron levels (anemia) and Rh antibodies. Later in the pregnancy, blood tests for diabetes will be done along with other tests if problems develop.  Urine tests to check for infections, diabetes, or protein in the urine.  An ultrasound to confirm the  proper growth and development of the baby.  An amniocentesis to check for possible genetic problems.  Fetal screens for spina bifida and Down syndrome.  You may need other tests to make sure you and the baby are doing well.  HIV (human immunodeficiency virus) testing. Routine prenatal testing includes screening for HIV, unless you choose not to have this test. HOME CARE INSTRUCTIONS  Medicines  Follow your health care provider's instructions regarding medicine use. Specific medicines may be either safe or unsafe to take during pregnancy.  Take your prenatal vitamins as directed.  If you develop constipation, try taking a stool softener if your health care provider approves. Diet  Eat regular, well-balanced meals. Choose a variety of foods, such as meat or vegetable-based protein, fish, milk and low-fat dairy products, vegetables, fruits, and whole grain breads and cereals. Your health care provider will help you determine the amount of weight gain that is right for you.  Avoid raw meat and uncooked cheese. These carry germs that can cause birth defects in the baby.  Eating four or five small meals rather than three large meals a day may help relieve nausea and vomiting. If you start to feel nauseous, eating a few soda crackers can be helpful. Drinking liquids between meals instead of during meals also seems to help nausea and vomiting.  If you develop constipation, eat more high-fiber foods, such as fresh vegetables or fruit and whole grains. Drink enough fluids to keep your urine clear or pale yellow. Activity and Exercise  Exercise only as directed by your health care provider. Exercising will help you:  Control your weight.  Stay in shape.  Be prepared for labor and delivery.  Experiencing pain or cramping in the lower abdomen or low back is a good sign that you should stop exercising. Check with your health care provider before continuing normal exercises.  Try to avoid  standing for long periods of time. Move your legs often if you must stand in one place for a long time.  Avoid heavy lifting.  Wear low-heeled shoes, and practice good posture.  You may continue to have sex unless your health care provider directs you otherwise. Relief of Pain or Discomfort  Wear a good support bra for breast tenderness.   Take warm sitz baths to soothe any pain or discomfort caused by hemorrhoids. Use hemorrhoid cream if your health care provider approves.   Rest with your legs elevated if you have leg cramps or low back pain.  If you develop varicose veins in your legs, wear support hose. Elevate your feet for 15 minutes, 3-4 times a day. Limit salt in your diet. Prenatal Care  Schedule your prenatal visits by the twelfth week of pregnancy. They are usually scheduled monthly at first, then more often in the last 2 months before delivery.  Write down your questions. Take them to your prenatal visits.  Keep all your prenatal visits as directed by   your health care provider. Safety  Wear your seat belt at all times when driving.  Make a list of emergency phone numbers, including numbers for family, friends, the hospital, and police and fire departments. General Tips  Ask your health care provider for a referral to a local prenatal education class. Begin classes no later than at the beginning of month 6 of your pregnancy.  Ask for help if you have counseling or nutritional needs during pregnancy. Your health care provider can offer advice or refer you to specialists for help with various needs.  Do not use hot tubs, steam rooms, or saunas.  Do not douche or use tampons or scented sanitary pads.  Do not cross your legs for long periods of time.  Avoid cat litter boxes and soil used by cats. These carry germs that can cause birth defects in the baby and possibly loss of the fetus by miscarriage or stillbirth.  Avoid all smoking, herbs, alcohol, and medicines  not prescribed by your health care provider. Chemicals in these affect the formation and growth of the baby.  Do not use any tobacco products, including cigarettes, chewing tobacco, and electronic cigarettes. If you need help quitting, ask your health care provider. You may receive counseling support and other resources to help you quit.  Schedule a dentist appointment. At home, brush your teeth with a soft toothbrush and be gentle when you floss. SEEK MEDICAL CARE IF:   You have dizziness.  You have mild pelvic cramps, pelvic pressure, or nagging pain in the abdominal area.  You have persistent nausea, vomiting, or diarrhea.  You have a bad smelling vaginal discharge.  You have pain with urination.  You notice increased swelling in your face, hands, legs, or ankles. SEEK IMMEDIATE MEDICAL CARE IF:   You have a fever.  You are leaking fluid from your vagina.  You have spotting or bleeding from your vagina.  You have severe abdominal cramping or pain.  You have rapid weight gain or loss.  You vomit blood or material that looks like coffee grounds.  You are exposed to German measles and have never had them.  You are exposed to fifth disease or chickenpox.  You develop a severe headache.  You have shortness of breath.  You have any kind of trauma, such as from a fall or a car accident.   This information is not intended to replace advice given to you by your health care provider. Make sure you discuss any questions you have with your health care provider.   Document Released: 03/28/2001 Document Revised: 04/24/2014 Document Reviewed: 02/11/2013 [employer] Interactive Patient Education 2016 [employer]   Breastfeeding Deciding to breastfeed is one of the best choices you can make for you and your baby. A change in hormones during pregnancy causes your breast tissue to grow and increases the number and size of your milk ducts. These hormones also allow proteins, sugars,  and fats from your blood supply to make breast milk in your milk-producing glands. Hormones prevent breast milk from being released before your baby is born as well as prompt milk flow after birth. Once breastfeeding has begun, thoughts of your baby, as well as his or her sucking or crying, can stimulate the release of milk from your milk-producing glands.  BENEFITS OF BREASTFEEDING For Your Baby  Your first milk (colostrum) helps your baby's digestive system function better.  There are antibodies in your milk that help your baby fight off infections.  Your baby has   a lower incidence of asthma, allergies, and sudden infant death syndrome.  The nutrients in breast milk are better for your baby than infant formulas and are designed uniquely for your baby's needs.  Breast milk improves your baby's brain development.  Your baby is less likely to develop other conditions, such as childhood obesity, asthma, or type 2 diabetes mellitus. For You  Breastfeeding helps to create a very special bond between you and your baby.  Breastfeeding is convenient. Breast milk is always available at the correct temperature and costs nothing.  Breastfeeding helps to burn calories and helps you lose the weight gained during pregnancy.  Breastfeeding makes your uterus contract to its prepregnancy size faster and slows bleeding (lochia) after you give birth.   Breastfeeding helps to lower your risk of developing type 2 diabetes mellitus, osteoporosis, and breast or ovarian cancer later in life. SIGNS THAT YOUR BABY IS HUNGRY Early Signs of Hunger  Increased alertness or activity.  Stretching.  Movement of the head from side to side.  Movement of the head and opening of the mouth when the corner of the mouth or cheek is stroked (rooting).  Increased sucking sounds, smacking lips, cooing, sighing, or squeaking.  Hand-to-mouth movements.  Increased sucking of fingers or hands. Late Signs of  Hunger  Fussing.  Intermittent crying. Extreme Signs of Hunger Signs of extreme hunger will require calming and consoling before your baby will be able to breastfeed successfully. Do not wait for the following signs of extreme hunger to occur before you initiate breastfeeding:  Restlessness.  A loud, strong cry.  Screaming. BREASTFEEDING BASICS Breastfeeding Initiation  Find a comfortable place to sit or lie down, with your neck and back well supported.  Place a pillow or rolled up blanket under your baby to bring him or her to the level of your breast (if you are seated). Nursing pillows are specially designed to help support your arms and your baby while you breastfeed.  Make sure that your baby's abdomen is facing your abdomen.  Gently massage your breast. With your fingertips, massage from your chest wall toward your nipple in a circular motion. This encourages milk flow. You may need to continue this action during the feeding if your milk flows slowly.  Support your breast with 4 fingers underneath and your thumb above your nipple. Make sure your fingers are well away from your nipple and your baby's mouth.  Stroke your baby's lips gently with your finger or nipple.  When your baby's mouth is open wide enough, quickly bring your baby to your breast, placing your entire nipple and as much of the colored area around your nipple (areola) as possible into your baby's mouth.  More areola should be visible above your baby's upper lip than below the lower lip.  Your baby's tongue should be between his or her lower gum and your breast.  Ensure that your baby's mouth is correctly positioned around your nipple (latched). Your baby's lips should create a seal on your breast and be turned out (everted).  It is common for your baby to suck about 2-3 minutes in order to start the flow of breast milk. Latching Teaching your baby how to latch on to your breast properly is very important.  An improper latch can cause nipple pain and decreased milk supply for you and poor weight gain in your baby. Also, if your baby is not latched onto your nipple properly, he or she may swallow some air during feeding. This   can make your baby fussy. Burping your baby when you switch breasts during the feeding can help to get rid of the air. However, teaching your baby to latch on properly is still the best way to prevent fussiness from swallowing air while breastfeeding. Signs that your baby has successfully latched on to your nipple:  Silent tugging or silent sucking, without causing you pain.  Swallowing heard between every 3-4 sucks.  Muscle movement above and in front of his or her ears while sucking. Signs that your baby has not successfully latched on to nipple:  Sucking sounds or smacking sounds from your baby while breastfeeding.  Nipple pain. If you think your baby has not latched on correctly, slip your finger into the corner of your baby's mouth to break the suction and place it between your baby's gums. Attempt breastfeeding initiation again. Signs of Successful Breastfeeding Signs from your baby:  A gradual decrease in the number of sucks or complete cessation of sucking.  Falling asleep.  Relaxation of his or her body.  Retention of a small amount of milk in his or her mouth.  Letting go of your breast by himself or herself. Signs from you:  Breasts that have increased in firmness, weight, and size 1-3 hours after feeding.  Breasts that are softer immediately after breastfeeding.  Increased milk volume, as well as a change in milk consistency and color by the fifth day of breastfeeding.  Nipples that are not sore, cracked, or bleeding. Signs That Your Baby is Getting Enough Milk  Wetting at least 3 diapers in a 24-hour period. The urine should be clear and pale yellow by age 5 days.  At least 3 stools in a 24-hour period by age 5 days. The stool should be soft and  yellow.  At least 3 stools in a 24-hour period by age 7 days. The stool should be seedy and yellow.  No loss of weight greater than 10% of birth weight during the first 3 days of age.  Average weight gain of 4-7 ounces (113-198 g) per week after age 4 days.  Consistent daily weight gain by age 5 days, without weight loss after the age of 2 weeks. After a feeding, your baby may spit up a small amount. This is common. BREASTFEEDING FREQUENCY AND DURATION Frequent feeding will help you make more milk and can prevent sore nipples and breast engorgement. Breastfeed when you feel the need to reduce the fullness of your breasts or when your baby shows signs of hunger. This is called "breastfeeding on demand." Avoid introducing a pacifier to your baby while you are working to establish breastfeeding (the first 4-6 weeks after your baby is born). After this time you may choose to use a pacifier. Research has shown that pacifier use during the first year of a baby's life decreases the risk of sudden infant death syndrome (SIDS). Allow your baby to feed on each breast as long as he or she wants. Breastfeed until your baby is finished feeding. When your baby unlatches or falls asleep while feeding from the first breast, offer the second breast. Because newborns are often sleepy in the first few weeks of life, you may need to awaken your baby to get him or her to feed. Breastfeeding times will vary from baby to baby. However, the following rules can serve as a guide to help you ensure that your baby is properly fed:  Newborns (babies 4 weeks of age or younger) may breastfeed every 1-3 hours.    Newborns should not go longer than 3 hours during the day or 5 hours during the night without breastfeeding.  You should breastfeed your baby a minimum of 8 times in a 24-hour period until you begin to introduce solid foods to your baby at around 6 months of age. BREAST MILK PUMPING Pumping and storing breast milk  allows you to ensure that your baby is exclusively fed your breast milk, even at times when you are unable to breastfeed. This is especially important if you are going back to work while you are still breastfeeding or when you are not able to be present during feedings. Your lactation consultant can give you guidelines on how long it is safe to store breast milk. A breast pump is a machine that allows you to pump milk from your breast into a sterile bottle. The pumped breast milk can then be stored in a refrigerator or freezer. Some breast pumps are operated by hand, while others use electricity. Ask your lactation consultant which type will work best for you. Breast pumps can be purchased, but some hospitals and breastfeeding support groups lease breast pumps on a monthly basis. A lactation consultant can teach you how to hand express breast milk, if you prefer not to use a pump. CARING FOR YOUR BREASTS WHILE YOU BREASTFEED Nipples can become dry, cracked, and sore while breastfeeding. The following recommendations can help keep your breasts moisturized and healthy:  Avoid using soap on your nipples.  Wear a supportive bra. Although not required, special nursing bras and tank tops are designed to allow access to your breasts for breastfeeding without taking off your entire bra or top. Avoid wearing underwire-style bras or extremely tight bras.  Air dry your nipples for 3-4minutes after each feeding.  Use only cotton bra pads to absorb leaked breast milk. Leaking of breast milk between feedings is normal.  Use lanolin on your nipples after breastfeeding. Lanolin helps to maintain your skin's normal moisture barrier. If you use pure lanolin, you do not need to wash it off before feeding your baby again. Pure lanolin is not toxic to your baby. You may also hand express a few drops of breast milk and gently massage that milk into your nipples and allow the milk to air dry. In the first few weeks after  giving birth, some women experience extremely full breasts (engorgement). Engorgement can make your breasts feel heavy, warm, and tender to the touch. Engorgement peaks within 3-5 days after you give birth. The following recommendations can help ease engorgement:  Completely empty your breasts while breastfeeding or pumping. You may want to start by applying warm, moist heat (in the shower or with warm water-soaked hand towels) just before feeding or pumping. This increases circulation and helps the milk flow. If your baby does not completely empty your breasts while breastfeeding, pump any extra milk after he or she is finished.  Wear a snug bra (nursing or regular) or tank top for 1-2 days to signal your body to slightly decrease milk production.  Apply ice packs to your breasts, unless this is too uncomfortable for you.  Make sure that your baby is latched on and positioned properly while breastfeeding. If engorgement persists after 48 hours of following these recommendations, contact your health care provider or a lactation consultant. OVERALL HEALTH CARE RECOMMENDATIONS WHILE BREASTFEEDING  Eat healthy foods. Alternate between meals and snacks, eating 3 of each per day. Because what you eat affects your breast milk, some of the foods   may make your baby more irritable than usual. Avoid eating these foods if you are sure that they are negatively affecting your baby.  Drink milk, fruit juice, and water to satisfy your thirst (about 10 glasses a day).  Rest often, relax, and continue to take your prenatal vitamins to prevent fatigue, stress, and anemia.  Continue breast self-awareness checks.  Avoid chewing and smoking tobacco. Chemicals from cigarettes that pass into breast milk and exposure to secondhand smoke may harm your baby.  Avoid alcohol and drug use, including marijuana. Some medicines that may be harmful to your baby can pass through breast milk. It is important to ask your health  care provider before taking any medicine, including all over-the-counter and prescription medicine as well as vitamin and herbal supplements. It is possible to become pregnant while breastfeeding. If birth control is desired, ask your health care provider about options that will be safe for your baby. SEEK MEDICAL CARE IF:  You feel like you want to stop breastfeeding or have become frustrated with breastfeeding.  You have painful breasts or nipples.  Your nipples are cracked or bleeding.  Your breasts are red, tender, or warm.  You have a swollen area on either breast.  You have a fever or chills.  You have nausea or vomiting.  You have drainage other than breast milk from your nipples.  Your breasts do not become full before feedings by the fifth day after you give birth.  You feel sad and depressed.  Your baby is too sleepy to eat well.  Your baby is having trouble sleeping.   Your baby is wetting less than 3 diapers in a 24-hour period.  Your baby has less than 3 stools in a 24-hour period.  Your baby's skin or the white part of his or her eyes becomes yellow.   Your baby is not gaining weight by 5 days of age. SEEK IMMEDIATE MEDICAL CARE IF:  Your baby is overly tired (lethargic) and does not want to wake up and feed.  Your baby develops an unexplained fever.   This information is not intended to replace advice given to you by your health care provider. Make sure you discuss any questions you have with your health care provider.   Document Released: 04/03/2005 Document Revised: 12/23/2014 Document Reviewed: 09/25/2012 [employer] Interactive Patient Education 2016 [employer]  

## 2015-04-15 NOTE — Progress Notes (Signed)
   Subjective:    [name] is a G5P3003 4860w3d being seen today for her first obstetrical visit.  Her obstetrical history is not significant. Pregnancy history fully reviewed.  Patient reports nausea.  Filed Vitals:   04/15/15 1410  BP: 114/66  Pulse: 69  Weight: 183 lb (83.008 kg)    HISTORY: OB History  Gravida Para Term Preterm AB SAB TAB Ectopic Multiple Living  5 3 3       3     # Outcome Date GA Lbr Len/2nd Weight Sex Delivery Anes PTL Lv  5 Current           4 Term 08/15/03 6432w0d  8 lb 2 oz (3.685 kg) F Vag-Spont None N Y  3 Term 04/06/01 3132w0d  6 lb 2 oz (2.778 kg) M Vag-Spont None N Y  2 Term 05/17/00 4532w0d  6 lb 5 oz (2.863 kg) F Vag-Spont EPI N Y  1 Gravida              Comments: System Generated. Please review and update pregnancy details.     Past Medical History  Diagnosis Date  . Chest pain   . H/O anxiety disorder   . Anemia   . Abnormal pap     cryo  . Vaginal Pap smear, abnormal    Past Surgical History  Procedure Laterality Date  . Wisdom tooth extraction    . Cervical cryo     Family History  Problem Relation Age of Onset  . Diabetes Maternal Aunt   . Hyperlipidemia      family history  . Hypertension Mother   . Heart failure Mother   . Hypertension Father   . Cancer Maternal Aunt     breast  . Diabetes Mother      Exam    Pelvic Exam:    Perineum: Normal Perineum  System: Breast:  normal appearance, no masses or tenderness   Skin: normal coloration and turgor, no rashes    Neurologic: oriented   Extremities: normal strength, tone, and muscle mass   HEENT extra ocular movement intact and sclera clear, anicteric   Mouth/Teeth mucous membranes moist, pharynx normal without lesions   Neck supple   Cardiovascular: regular rate and rhythm, no murmurs or gallops   Respiratory:  appears well, vitals normal, no respiratory distress, acyanotic, normal RR, ear and throat exam is normal, neck free of mass or lymphadenopathy, chest  clear, no wheezing, crepitations, rhonchi, normal symmetric air entry   Abdomen: soft, non-tender; bowel sounds normal; no masses,  no organomegaly      Assessment/Plan:    Pregnancy: G5P3003   1. Supervision of normal pregnancy, first trimester  - Urine cytology ancillary only - CULTURE, URINE COMPREHENSIVE - Prenatal (OB Panel) - HIV antibody (with reflex) - US MFM Fetal Nuchal Translucency; Future  Return in 4 weeks (on 05/13/2015).   Norvel Wenker S 04/15/2015

## 2015-04-16 LAB — OBSTETRIC PANEL
ANTIBODY SCREEN: NEGATIVE
BASOS PCT: 0 % (ref 0–1)
Basophils Absolute: 0 10*3/uL (ref 0.0–0.1)
EOS PCT: 1 % (ref 0–5)
Eosinophils Absolute: 0.1 10*3/uL (ref 0.0–0.7)
HEMATOCRIT: 32.2 % — AB (ref 36.0–46.0)
Hemoglobin: 10.7 g/dL — ABNORMAL LOW (ref 12.0–15.0)
Hepatitis B Surface Ag: NEGATIVE
Lymphocytes Relative: 21 % (ref 12–46)
Lymphs Abs: 2.3 10*3/uL (ref 0.7–4.0)
MCH: 26 pg (ref 26.0–34.0)
MCHC: 33.2 g/dL (ref 30.0–36.0)
MCV: 78.3 fL (ref 78.0–100.0)
MONO ABS: 0.8 10*3/uL (ref 0.1–1.0)
MPV: 10.1 fL (ref 8.6–12.4)
Monocytes Relative: 7 % (ref 3–12)
Neutro Abs: 7.7 10*3/uL (ref 1.7–7.7)
Neutrophils Relative %: 71 % (ref 43–77)
PLATELETS: 317 10*3/uL (ref 150–400)
RBC: 4.11 MIL/uL (ref 3.87–5.11)
RDW: 14.2 % (ref 11.5–15.5)
RH TYPE: POSITIVE
Rubella: 1.41 Index — ABNORMAL HIGH (ref <0.90)
WBC: 10.8 10*3/uL — AB (ref 4.0–10.5)

## 2015-04-16 LAB — HIV ANTIBODY (ROUTINE TESTING W REFLEX): HIV 1&2 Ab, 4th Generation: NONREACTIVE

## 2015-04-18 NOTE — L&D Delivery Note (Signed)
Delivery Note At 10:20 AM a viable and healthy female was delivered via  (Presentation: ROA).  APGAR: 9,9; weight pending.  Placenta status: intact, spontaneous.  Cord: 3 vessel for cord blood donation. with the following complications: none.  Anesthesia:  epidural Episiotomy:  none Lacerations:  none Suture Repair: none Est. Blood Loss (mL):  61mL  Mom to postpartum.  Baby to Couplet care / Skin to Skin.  Loni Muse 11/17/2015, 10:37 AM  Patient is a [id number] at [redacted]w[redacted]d who was admitted in active labor, uncomplicated prenatal course.  She progressed without augmentation.  I was gloved and present for delivery in its entirety.  Second stage of labor progressed, baby delivered after a few contractions.  mild decels during second stage noted.  Complications: none  Lacerations: none  EBL: 50cc  Cam Hai, CNM 4:35 PM 11/17/2015

## 2015-04-30 ENCOUNTER — Encounter (HOSPITAL_COMMUNITY): Payer: Self-pay | Admitting: Family Medicine

## 2015-05-06 ENCOUNTER — Ambulatory Visit (HOSPITAL_COMMUNITY): Payer: Commercial Managed Care - PPO | Attending: Family Medicine

## 2015-05-06 ENCOUNTER — Other Ambulatory Visit (HOSPITAL_COMMUNITY): Payer: Commercial Managed Care - PPO

## 2015-05-13 ENCOUNTER — Encounter: Payer: Commercial Managed Care - PPO | Admitting: Obstetrics & Gynecology

## 2015-05-20 ENCOUNTER — Encounter (INDEPENDENT_AMBULATORY_CARE_PROVIDER_SITE_OTHER): Payer: Self-pay

## 2015-05-20 ENCOUNTER — Ambulatory Visit (INDEPENDENT_AMBULATORY_CARE_PROVIDER_SITE_OTHER): Payer: Commercial Managed Care - PPO | Admitting: Obstetrics & Gynecology

## 2015-05-20 VITALS — BP 130/69 | HR 61 | Wt 184.0 lb

## 2015-05-20 DIAGNOSIS — Z113 Encounter for screening for infections with a predominantly sexual mode of transmission: Secondary | ICD-10-CM

## 2015-05-20 DIAGNOSIS — N898 Other specified noninflammatory disorders of vagina: Secondary | ICD-10-CM

## 2015-05-20 DIAGNOSIS — Z3482 Encounter for supervision of other normal pregnancy, second trimester: Secondary | ICD-10-CM

## 2015-05-20 DIAGNOSIS — Z3492 Encounter for supervision of normal pregnancy, unspecified, second trimester: Secondary | ICD-10-CM

## 2015-05-20 NOTE — Addendum Note (Signed)
Addended by: Granville Lewis on: 05/20/2015 04:23 PM   Modules accepted: Orders

## 2015-05-20 NOTE — Progress Notes (Signed)
Vaginal irritation with D/C

## 2015-05-20 NOTE — Progress Notes (Signed)
Subjective:  [name] is a 35 y.o. G5P3003 at [redacted]w[redacted]d being seen today for ongoing prenatal care.  She is currently monitored for the following issues for this low-risk pregnancy and has Anemia; ANXIETY DISORDER, GENERALIZED; INSOMNIA; FATIGUE; HEADACHE; PAP SMEAR, LGSIL, ABNORMAL; and Supervision of normal pregnancy on her problem list.  Patient reports vaginal irritation.   . Vag. Bleeding: None.   . Denies leaking of fluid.   The following portions of the patient's history were reviewed and updated as appropriate: allergies, current medications, past family history, past medical history, past social history, past surgical history and problem list. Problem list updated.  Objective:   Filed Vitals:   05/20/15 1535  BP: 130/69  Pulse: 61  Weight: 184 lb (83.462 kg)    Fetal Status:           General:  Alert, oriented and cooperative. Patient is in no acute distress.  Skin: Skin is warm and dry. No rash noted.   Cardiovascular: Normal heart rate noted  Respiratory: Normal respiratory effort, no problems with respiration noted  Abdomen: Soft, gravid, appropriate for gestational age.       Pelvic: Vag. Bleeding: None Vag D/C Character: White   Cervical exam deferred        Extremities: Normal range of motion.  Edema: None  Mental Status: Normal mood and affect. Normal behavior. Normal judgment and thought content.   Urinalysis: Urine Protein: Negative Urine Glucose: Negative  Assessment and Plan:  Pregnancy: G5P3003 at [redacted]w[redacted]d  1. Supervision of normal pregnancy, second trimester -Quad screen next visit--pt missed first screen appt - [place] MFM OB DETAIL +14 WK; Future  1.  Vaginal irritation -BD affirm; treat based on results -encouraged my chart  Preterm labor symptoms and general obstetric precautions including but not limited to vaginal bleeding, contractions, leaking of fluid and fetal movement were reviewed in detail with the patient. Please refer to After Visit Summary  for other counseling recommendations.  Return in about 4 weeks (around 06/17/2015).   Lesly Dukes, MD

## 2015-05-20 NOTE — Patient Instructions (Signed)
Etonogestrel implant What is this medicine? ETONOGESTREL (et oh noe JES trel) is a contraceptive (birth control) device. It is used to prevent pregnancy. It can be used for up to 3 years. This medicine may be used for other purposes; ask your health care provider or pharmacist if you have questions. What should I tell my health care provider before I take this medicine? They need to know if you have any of these conditions: -abnormal vaginal bleeding -blood vessel disease or blood clots -cancer of the breast, cervix, or liver -depression -diabetes -gallbladder disease -headaches -heart disease or recent heart attack -high blood pressure -high cholesterol -kidney disease -liver disease -renal disease -seizures -tobacco smoker -an unusual or allergic reaction to etonogestrel, other hormones, anesthetics or antiseptics, medicines, foods, dyes, or preservatives -pregnant or trying to get pregnant -breast-feeding How should I use this medicine? This device is inserted just under the skin on the inner side of your upper arm by a health care professional. Talk to your pediatrician regarding the use of this medicine in children. Special care may be needed. Overdosage: If you think you have taken too much of this medicine contact a poison control center or emergency room at once. NOTE: This medicine is only for you. Do not share this medicine with others. What if I miss a dose? This does not apply. What may interact with this medicine? Do not take this medicine with any of the following medications: -amprenavir -bosentan -fosamprenavir This medicine may also interact with the following medications: -barbiturate medicines for inducing sleep or treating seizures -certain medicines for fungal infections like ketoconazole and itraconazole -griseofulvin -medicines to treat seizures like carbamazepine, felbamate, oxcarbazepine, phenytoin,  topiramate -modafinil -phenylbutazone -rifampin -some medicines to treat HIV infection like atazanavir, indinavir, lopinavir, nelfinavir, tipranavir, ritonavir -St. John's wort This list may not describe all possible interactions. Give your health care provider a list of all the medicines, herbs, non-prescription drugs, or dietary supplements you use. Also tell them if you smoke, drink alcohol, or use illegal drugs. Some items may interact with your medicine. What should I watch for while using this medicine? This product does not protect you against HIV infection (AIDS) or other sexually transmitted diseases. You should be able to feel the implant by pressing your fingertips over the skin where it was inserted. Contact your doctor if you cannot feel the implant, and use a non-hormonal birth control method (such as condoms) until your doctor confirms that the implant is in place. If you feel that the implant may have broken or become bent while in your arm, contact your healthcare provider. What side effects may I notice from receiving this medicine? Side effects that you should report to your doctor or health care professional as soon as possible: -allergic reactions like skin rash, itching or hives, swelling of the face, lips, or tongue -breast lumps -changes in emotions or moods -depressed mood -heavy or prolonged menstrual bleeding -pain, irritation, swelling, or bruising at the insertion site -scar at site of insertion -signs of infection at the insertion site such as fever, and skin redness, pain or discharge -signs of pregnancy -signs and symptoms of a blood clot such as breathing problems; changes in vision; chest pain; severe, sudden headache; pain, swelling, warmth in the leg; trouble speaking; sudden numbness or weakness of the face, arm or leg -signs and symptoms of liver injury like dark yellow or brown urine; general ill feeling or flu-like symptoms; light-colored stools; loss of  appetite; nausea; right upper belly   pain; unusually weak or tired; yellowing of the eyes or skin -unusual vaginal bleeding, discharge -signs and symptoms of a stroke like changes in vision; confusion; trouble speaking or understanding; severe headaches; sudden numbness or weakness of the face, arm or leg; trouble walking; dizziness; loss of balance or coordination Side effects that usually do not require medical attention (Report these to your doctor or health care professional if they continue or are bothersome.): -acne -back pain -breast pain -changes in weight -dizziness -general ill feeling or flu-like symptoms -headache -irregular menstrual bleeding -nausea -sore throat -vaginal irritation or inflammation This list may not describe all possible side effects. Call your doctor for medical advice about side effects. You may report side effects to FDA at 1-800-FDA-1088. Where should I keep my medicine? This drug is given in a hospital or clinic and will not be stored at home. NOTE: This sheet is a summary. It may not cover all possible information. If you have questions about this medicine, talk to your doctor, pharmacist, or health care provider.    2016, Elsevier/Gold Standard. (2014-01-16 14:07:06)  

## 2015-05-21 LAB — WET PREP BY MOLECULAR PROBE
CANDIDA SPECIES: POSITIVE — AB
Gardnerella vaginalis: NEGATIVE
Trichomonas vaginosis: NEGATIVE

## 2015-05-24 ENCOUNTER — Telehealth: Payer: Self-pay | Admitting: *Deleted

## 2015-05-24 DIAGNOSIS — Z3492 Encounter for supervision of normal pregnancy, unspecified, second trimester: Secondary | ICD-10-CM

## 2015-05-24 LAB — CULTURE, URINE COMPREHENSIVE

## 2015-05-24 LAB — GC/CHLAMYDIA PROBE AMP (~~LOC~~) NOT AT ARMC
CHLAMYDIA, DNA PROBE: NEGATIVE
Neisseria Gonorrhea: NEGATIVE

## 2015-05-24 NOTE — Telephone Encounter (Signed)
-----   Message from Lesly Dukes, MD sent at 05/23/2015  7:08 AM EST ----- RN to call patient and instruct to use otc yeast vaginitis medication

## 2015-05-24 NOTE — Telephone Encounter (Signed)
Pt notified of affirm results.  She is instructed to get OTC Monistat 7 for her yeast.

## 2015-05-24 NOTE — Telephone Encounter (Signed)
Reordered urine cytology 

## 2015-06-16 ENCOUNTER — Other Ambulatory Visit: Payer: Self-pay | Admitting: Obstetrics & Gynecology

## 2015-06-16 ENCOUNTER — Ambulatory Visit (HOSPITAL_COMMUNITY)
Admission: RE | Admit: 2015-06-16 | Discharge: 2015-06-16 | Disposition: A | Discharge status: Home / Self Care | Payer: Commercial Managed Care - PPO | Source: Ambulatory Visit | Attending: Obstetrics & Gynecology | Admitting: Obstetrics & Gynecology

## 2015-06-16 ENCOUNTER — Ambulatory Visit (INDEPENDENT_AMBULATORY_CARE_PROVIDER_SITE_OTHER): Payer: Commercial Managed Care - PPO | Admitting: Obstetrics & Gynecology

## 2015-06-16 VITALS — BP 129/68 | HR 77 | Wt 185.0 lb

## 2015-06-16 DIAGNOSIS — Z3492 Encounter for supervision of normal pregnancy, unspecified, second trimester: Secondary | ICD-10-CM

## 2015-06-16 DIAGNOSIS — Z36 Encounter for antenatal screening of mother: Secondary | ICD-10-CM | POA: Insufficient documentation

## 2015-06-16 DIAGNOSIS — F411 Generalized anxiety disorder: Secondary | ICD-10-CM

## 2015-06-16 DIAGNOSIS — Z3A18 18 weeks gestation of pregnancy: Secondary | ICD-10-CM | POA: Diagnosis not present

## 2015-06-16 DIAGNOSIS — Z3482 Encounter for supervision of other normal pregnancy, second trimester: Secondary | ICD-10-CM

## 2015-06-16 DIAGNOSIS — Z3689 Encounter for other specified antenatal screening: Secondary | ICD-10-CM

## 2015-06-17 ENCOUNTER — Encounter: Payer: Commercial Managed Care - PPO | Admitting: Obstetrics & Gynecology

## 2015-06-21 LAB — AFP, QUAD SCREEN
AFP: 48.4 ng/mL
CURR GEST AGE: 18 wks.days
Down Syndrome Scr Risk Est: 1:27300 {titer}
HCG TOTAL: 18.07 [IU]/mL
INH: 96.4 pg/mL
Interpretation-AFP: NEGATIVE
MOM FOR AFP: 1.28
MOM FOR HCG: 0.73
MoM for INH: 0.62
Open Spina bifida: NEGATIVE
Osb Risk: 1:5270 {titer}
TRI 18 SCR RISK EST: NEGATIVE
UE3 VALUE: 1.1 ng/mL
uE3 Mom: 0.88

## 2015-06-29 ENCOUNTER — Telehealth: Payer: Self-pay

## 2015-06-29 NOTE — Telephone Encounter (Signed)
Pt. Called asking what can she take to help with sinuses. Advise patient to take Mucinex and Delsym for cough. Also Tylenol for pain.

## 2015-07-14 ENCOUNTER — Ambulatory Visit (INDEPENDENT_AMBULATORY_CARE_PROVIDER_SITE_OTHER): Payer: Commercial Managed Care - PPO | Admitting: Obstetrics & Gynecology

## 2015-07-14 VITALS — BP 120/63 | HR 83 | Wt 190.0 lb

## 2015-07-14 DIAGNOSIS — F411 Generalized anxiety disorder: Secondary | ICD-10-CM

## 2015-07-14 DIAGNOSIS — Z3492 Encounter for supervision of normal pregnancy, unspecified, second trimester: Secondary | ICD-10-CM

## 2015-07-14 NOTE — Progress Notes (Signed)
Subjective:  [name] is a 35 y.o. S AA [id number] at 4757w2d being seen today for ongoing prenatal care.  She is currently monitored for the following issues for this low-risk pregnancy and has Anemia; ANXIETY DISORDER, GENERALIZED; INSOMNIA; FATIGUE; HEADACHE; PAP SMEAR, LGSIL, ABNORMAL; and Supervision of normal pregnancy on her problem list.  Patient reports no complaints.  Contractions: Not present. Vag. Bleeding: None.  Movement: Absent. Denies leaking of fluid.   The following portions of the patient's history were reviewed and updated as appropriate: allergies, current medications, past family history, past medical history, past social history, past surgical history and problem list. Problem list updated.  Objective:   Filed Vitals:   07/14/15 1109  BP: 120/63  Pulse: 83  Weight: 190 lb (86.183 kg)    Fetal Status: Fetal Heart Rate (bpm): 140   Movement: Absent     General:  Alert, oriented and cooperative. Patient is in no acute distress.  Skin: Skin is warm and dry. No rash noted.   Cardiovascular: Normal heart rate noted  Respiratory: Normal respiratory effort, no problems with respiration noted  Abdomen: Soft, gravid, appropriate for gestational age. Pain/Pressure: Absent     Pelvic: Vag. Bleeding: None Vag D/C Character: Thin   Cervical exam deferred        Extremities: Normal range of motion.  Edema: None  Mental Status: Normal mood and affect. Normal behavior. Normal judgment and thought content.   Urinalysis: Urine Protein: Negative Urine Glucose: Negative  Assessment and Plan:  Pregnancy: G5P3003 at 3357w2d  1. Supervision of normal pregnancy, second trimester  - US MFM OB FOLLOW UP; Future  2. ANXIETY DISORDER, GENERALIZED   Preterm labor symptoms and general obstetric precautions including but not limited to vaginal bleeding, contractions, leaking of fluid and fetal movement were reviewed in detail with the patient. Please refer to After Visit Summary for  other counseling recommendations.  Return in about 4 weeks (around 08/11/2015).   Allie BossierMyra C Sanita Estrada, MD

## 2015-07-19 ENCOUNTER — Ambulatory Visit (HOSPITAL_COMMUNITY)
Admission: RE | Admit: 2015-07-19 | Discharge: 2015-07-19 | Disposition: A | Discharge status: Home / Self Care | Payer: Commercial Managed Care - PPO | Source: Ambulatory Visit | Attending: Obstetrics & Gynecology | Admitting: Obstetrics & Gynecology

## 2015-07-19 DIAGNOSIS — Z3A23 23 weeks gestation of pregnancy: Secondary | ICD-10-CM | POA: Insufficient documentation

## 2015-07-19 DIAGNOSIS — Z36 Encounter for antenatal screening of mother: Secondary | ICD-10-CM | POA: Diagnosis present

## 2015-07-19 DIAGNOSIS — Z3492 Encounter for supervision of normal pregnancy, unspecified, second trimester: Secondary | ICD-10-CM

## 2015-08-11 ENCOUNTER — Encounter: Payer: Commercial Managed Care - PPO | Admitting: Obstetrics & Gynecology

## 2015-09-16 ENCOUNTER — Ambulatory Visit (INDEPENDENT_AMBULATORY_CARE_PROVIDER_SITE_OTHER): Payer: Commercial Managed Care - PPO | Admitting: Obstetrics & Gynecology

## 2015-09-16 VITALS — BP 115/67 | HR 84 | Wt 196.0 lb

## 2015-09-16 DIAGNOSIS — Z23 Encounter for immunization: Secondary | ICD-10-CM

## 2015-09-16 DIAGNOSIS — Z3493 Encounter for supervision of normal pregnancy, unspecified, third trimester: Secondary | ICD-10-CM

## 2015-09-16 DIAGNOSIS — Z3483 Encounter for supervision of other normal pregnancy, third trimester: Secondary | ICD-10-CM

## 2015-09-16 DIAGNOSIS — Z3492 Encounter for supervision of normal pregnancy, unspecified, second trimester: Secondary | ICD-10-CM

## 2015-09-16 NOTE — Progress Notes (Signed)
Subjective:  [name] is a 35 y.o. G5P3003 at 529w3d being seen today for ongoing prenatal care.  She is currently monitored for the following issues for this high-risk pregnancy and has Anemia; ANXIETY DISORDER, GENERALIZED; INSOMNIA; FATIGUE; HEADACHE; PAP SMEAR, LGSIL, ABNORMAL; and Supervision of normal pregnancy on her problem list., lapse in prenatal care (approx 10 weeks)  Patient reports Braxton Hicks contractions.  a few a day.  Never more than a few an hour.  Contractions: Irritability. Vag. Bleeding: None.  Movement: Present. Denies leaking of fluid.   The following portions of the patient's history were reviewed and updated as appropriate: allergies, current medications, past family history, past medical history, past social history, past surgical history and problem list. Problem list updated.  Objective:   Filed Vitals:   09/16/15 1448  BP: 115/67  Pulse: 84  Weight: 196 lb (88.905 kg)    Fetal Status: Fetal Heart Rate (bpm): 134 Fundal Height: 31 cm Movement: Present     General:  Alert, oriented and cooperative. Patient is in no acute distress.  Skin: Skin is warm and dry. No rash noted.   Cardiovascular: Normal heart rate noted  Respiratory: Normal respiratory effort, no problems with respiration noted  Abdomen: Soft, gravid, appropriate for gestational age. Pain/Pressure: Present     Pelvic: Vag. Bleeding: None Vag D/C Character: Thin   Cervical exam deferred        Extremities: Normal range of motion.  Edema: Trace  Mental Status: Normal mood and affect. Normal behavior. Normal judgment and thought content.   Urinalysis: Urine Protein: Trace Urine Glucose: Negative  Assessment and Plan:  Pregnancy: G5P3003 at 589w3d  1. Normal pregnancy in third trimester -Needs 28 week labs and GCT--wants to postpone for 1 more week - Tdap vaccine greater than or equal to 7yo IM - Encouraged to attend prenatal visits.  Preterm labor symptoms and general obstetric  precautions including but not limited to vaginal bleeding, contractions, leaking of fluid and fetal movement were reviewed in detail with the patient. Please refer to After Visit Summary for other counseling recommendations.  Return in about 2 weeks (around 09/30/2015).   Lesly DukesKelly H Mckensey Berghuis, MD

## 2015-09-21 ENCOUNTER — Other Ambulatory Visit: Payer: Commercial Managed Care - PPO

## 2015-09-30 ENCOUNTER — Ambulatory Visit (INDEPENDENT_AMBULATORY_CARE_PROVIDER_SITE_OTHER): Payer: Commercial Managed Care - PPO | Admitting: Obstetrics & Gynecology

## 2015-09-30 VITALS — BP 110/65 | Wt 197.0 lb

## 2015-09-30 DIAGNOSIS — Z36 Encounter for antenatal screening of mother: Secondary | ICD-10-CM | POA: Diagnosis not present

## 2015-09-30 DIAGNOSIS — D509 Iron deficiency anemia, unspecified: Secondary | ICD-10-CM

## 2015-09-30 DIAGNOSIS — Z3483 Encounter for supervision of other normal pregnancy, third trimester: Secondary | ICD-10-CM

## 2015-09-30 DIAGNOSIS — Z3493 Encounter for supervision of normal pregnancy, unspecified, third trimester: Secondary | ICD-10-CM

## 2015-09-30 DIAGNOSIS — F411 Generalized anxiety disorder: Secondary | ICD-10-CM

## 2015-09-30 MED ORDER — BREAST PUMP MISC: Status: DC

## 2015-09-30 NOTE — Progress Notes (Signed)
Subjective:  [name] is a 35 y.o. G5P3003 at 6912w3d being seen today for ongoing prenatal care.  She is currently monitored for the following issues for this low-risk pregnancy and has Anemia; ANXIETY DISORDER, GENERALIZED; INSOMNIA; FATIGUE; HEADACHE; PAP SMEAR, LGSIL, ABNORMAL; and Supervision of normal pregnancy on her problem list.  Patient reports no complaints.  Contractions: Irritability. Vag. Bleeding: None.  Movement: Present. Denies leaking of fluid.   The following portions of the patient's history were reviewed and updated as appropriate: allergies, current medications, past family history, past medical history, past social history, past surgical history and problem list. Problem list updated.  Objective:   Filed Vitals:   09/30/15 1337  BP: 110/65  Weight: 197 lb (89.359 kg)    Fetal Status: Fetal Heart Rate (bpm): 137   Movement: Present     General:  Alert, oriented and cooperative. Patient is in no acute distress.  Skin: Skin is warm and dry. No rash noted.   Cardiovascular: Normal heart rate noted  Respiratory: Normal respiratory effort, no problems with respiration noted  Abdomen: Soft, gravid, appropriate for gestational age. Pain/Pressure: Present     Pelvic: Cervical exam deferred        Extremities: Normal range of motion.  Edema: Trace  Mental Status: Normal mood and affect. Normal behavior. Normal judgment and thought content.   Urinalysis: Urine Protein: Negative Urine Glucose: Negative  Assessment and Plan:  Pregnancy: G5P3003 at 2712w3d  1. Normal pregnancy, third trimester  - Glucose Tolerance, 1 HR (50g) - CBC - RPR - HIV antibody (with reflex)  2. Supervision of normal pregnancy, third trimester -breast pump prescribed  3. ANXIETY DISORDER, GENERALIZED   4. Iron deficiency anemia   Preterm labor symptoms and general obstetric precautions including but not limited to vaginal bleeding, contractions, leaking of fluid and fetal movement  were reviewed in detail with the patient. Please refer to After Visit Summary for other counseling recommendations.  No Follow-up on file.   Allie BossierMyra C Vernelle Wisner, MD

## 2015-10-01 LAB — CBC
HCT: 27.3 % — ABNORMAL LOW (ref 35.0–45.0)
HEMOGLOBIN: 8.8 g/dL — AB (ref 11.7–15.5)
MCH: 24.8 pg — AB (ref 27.0–33.0)
MCHC: 32.2 g/dL (ref 32.0–36.0)
MCV: 76.9 fL — ABNORMAL LOW (ref 80.0–100.0)
MPV: 10.7 fL (ref 7.5–12.5)
PLATELETS: 210 10*3/uL (ref 140–400)
RBC: 3.55 MIL/uL — AB (ref 3.80–5.10)
RDW: 14.7 % (ref 11.0–15.0)
WBC: 8.8 10*3/uL (ref 3.8–10.8)

## 2015-10-01 LAB — HIV ANTIBODY (ROUTINE TESTING W REFLEX): HIV: NONREACTIVE

## 2015-10-01 LAB — RPR

## 2015-10-02 LAB — GLUCOSE TOLERANCE, 1 HOUR (50G) W/O FASTING: GLUCOSE, 1 HR, GESTATIONAL: 88 mg/dL (ref <140)

## 2015-10-04 ENCOUNTER — Telehealth: Payer: Self-pay | Admitting: *Deleted

## 2015-10-04 NOTE — Telephone Encounter (Signed)
LM on voicemail of normal 1 hr GTt but her Hgb is 8.8.  Recommended FeSo4 325 mg daily.

## 2015-10-21 ENCOUNTER — Encounter: Payer: Self-pay | Admitting: Obstetrics & Gynecology

## 2015-10-21 ENCOUNTER — Ambulatory Visit (INDEPENDENT_AMBULATORY_CARE_PROVIDER_SITE_OTHER): Payer: Commercial Managed Care - PPO | Admitting: Obstetrics & Gynecology

## 2015-10-21 VITALS — BP 128/79 | HR 77 | Wt 203.0 lb

## 2015-10-21 DIAGNOSIS — Z3483 Encounter for supervision of other normal pregnancy, third trimester: Secondary | ICD-10-CM

## 2015-10-21 DIAGNOSIS — Z36 Encounter for antenatal screening of mother: Secondary | ICD-10-CM | POA: Diagnosis not present

## 2015-10-21 DIAGNOSIS — F411 Generalized anxiety disorder: Secondary | ICD-10-CM

## 2015-10-21 DIAGNOSIS — Z3493 Encounter for supervision of normal pregnancy, unspecified, third trimester: Secondary | ICD-10-CM

## 2015-10-21 DIAGNOSIS — D500 Iron deficiency anemia secondary to blood loss (chronic): Secondary | ICD-10-CM

## 2015-10-21 NOTE — Progress Notes (Signed)
Subjective:  Rondall Allegraeshaun L Werk is a 35 y.o. G5P3003 at 248w3d being seen today for ongoing prenatal care.  She is currently monitored for the following issues for this low-risk pregnancy and has Anemia; ANXIETY DISORDER, GENERALIZED; INSOMNIA; FATIGUE; HEADACHE; PAP SMEAR, LGSIL, ABNORMAL; and Supervision of normal pregnancy on her problem list.  Patient reports no complaints.  Contractions: Irritability. Vag. Bleeding: None.  Movement: Present. Denies leaking of fluid.   The following portions of the patient's history were reviewed and updated as appropriate: allergies, current medications, past family history, past medical history, past social history, past surgical history and problem list. Problem list updated.  Objective:   Filed Vitals:   10/21/15 1349  BP: 128/79  Pulse: 77  Weight: 203 lb (92.08 kg)    Fetal Status: Fetal Heart Rate (bpm): 135   Movement: Present     General:  Alert, oriented and cooperative. Patient is in no acute distress.  Skin: Skin is warm and dry. No rash noted.   Cardiovascular: Normal heart rate noted  Respiratory: Normal respiratory effort, no problems with respiration noted  Abdomen: Soft, gravid, appropriate for gestational age. Pain/Pressure: Present     Pelvic:        as above  Extremities: Normal range of motion.  Edema: Mild pitting, slight indentation  Mental Status: Normal mood and affect. Normal behavior. Normal judgment and thought content.   Urinalysis:      Assessment and Plan:  Pregnancy: G5P3003 at 498w3d  1. Supervision of normal pregnancy, third trimester Labor precautions reviewed - Culture, beta strep (group b only)  2. ANXIETY DISORDER, GENERALIZED  3. Iron deficiency anemia due to chronic blood loss  Term labor symptoms and general obstetric precautions including but not limited to vaginal bleeding, contractions, leaking of fluid and fetal movement were reviewed in detail with the patient. Please refer to After Visit Summary  for other counseling recommendations.  No Follow-up on file.   Willodean Rosenthalarolyn Harraway-Smith, MD

## 2015-10-23 LAB — CULTURE, BETA STREP (GROUP B ONLY)

## 2015-10-25 ENCOUNTER — Encounter: Payer: Self-pay | Admitting: *Deleted

## 2015-10-27 LAB — OB RESULTS CONSOLE GBS: STREP GROUP B AG: POSITIVE

## 2015-10-29 ENCOUNTER — Encounter: Payer: Commercial Managed Care - PPO | Admitting: Advanced Practice Midwife

## 2015-11-01 ENCOUNTER — Ambulatory Visit (INDEPENDENT_AMBULATORY_CARE_PROVIDER_SITE_OTHER): Payer: Commercial Managed Care - PPO | Admitting: Advanced Practice Midwife

## 2015-11-01 ENCOUNTER — Encounter: Payer: Self-pay | Admitting: Advanced Practice Midwife

## 2015-11-01 VITALS — BP 124/67 | HR 77 | Wt 201.0 lb

## 2015-11-01 DIAGNOSIS — Z3483 Encounter for supervision of other normal pregnancy, third trimester: Secondary | ICD-10-CM

## 2015-11-01 DIAGNOSIS — Z3493 Encounter for supervision of normal pregnancy, unspecified, third trimester: Secondary | ICD-10-CM

## 2015-11-01 NOTE — Progress Notes (Signed)
Subjective:  [name] is a 35 y.o. G4P3003 at 5767w0d being seen today for ongoing prenatal care.  She is currently monitored for the following issues for this low-risk pregnancy and has Anemia; ANXIETY DISORDER, GENERALIZED; INSOMNIA; FATIGUE; HEADACHE; PAP SMEAR, LGSIL, ABNORMAL; and Supervision of normal pregnancy on her problem list.  Patient reports occasional contractions.  Contractions: Regular. Vag. Bleeding: None.  Movement: Present. Possible leaking of fluid 10/29/15. None since.    The following portions of the patient's history were reviewed and updated as appropriate: allergies, current medications, past family history, past medical history, past social history, past surgical history and problem list. Problem list updated.  Objective:   Filed Vitals:   11/01/15 0949  BP: 124/67  Pulse: 77  Weight: 201 lb (91.173 kg)    Fetal Status: Fetal Heart Rate (bpm): 141   Movement: Present  Presentation: Vertex  General:  Alert, oriented and cooperative. Patient is in no acute distress.  Skin: Skin is warm and dry. No rash noted.   Cardiovascular: Normal heart rate noted  Respiratory: Normal respiratory effort, no problems with respiration noted  Abdomen: Soft, gravid, appropriate for gestational age. Pain/Pressure: Present     Pelvic:  Cervical exam performed Dilation: 1 Effacement (%): 50 Station: -3. Fern, pool and nitrazine neg.   Extremities: Normal range of motion.  Edema: Mild pitting, slight indentation  Mental Status: Normal mood and affect. Normal behavior. Normal judgment and thought content.   Urinalysis:      Assessment and Plan:  Pregnancy: G4P3003 at 8967w0d  1. Supervision of normal pregnancy, third trimester  Term labor symptoms and general obstetric precautions including but not limited to vaginal bleeding, contractions, leaking of fluid and fetal movement were reviewed in detail with the patient. Please refer to After Visit Summary for other counseling  recommendations.  Return in about 1 week (around 11/08/2015).   Dorathy KinsmanVirginia Camyah Pultz, CNM

## 2015-11-01 NOTE — Patient Instructions (Signed)
Fetal Movement Counts  Patient Name: __________________________________________________ Patient Due Date: ____________________  Performing a fetal movement count is highly recommended in high-risk pregnancies, but it is good for every pregnant woman to do. Your health care provider may ask you to start counting fetal movements at 28 weeks of the pregnancy. Fetal movements often increase:  · After eating a full meal.  · After physical activity.  · After eating or drinking something sweet or cold.  · At rest.  Pay attention to when you feel the baby is most active. This will help you notice a pattern of your baby's sleep and wake cycles and what factors contribute to an increase in fetal movement. It is important to perform a fetal movement count at the same time each day when your baby is normally most active.   HOW TO COUNT FETAL MOVEMENTS  1. Find a quiet and comfortable area to sit or lie down on your left side. Lying on your left side provides the best blood and oxygen circulation to your baby.  2. Write down the day and time on a sheet of paper or in a journal.  3. Start counting kicks, flutters, swishes, rolls, or jabs in a 2-hour period. You should feel at least 10 movements within 2 hours.  4. If you do not feel 10 movements in 2 hours, wait 2-3 hours and count again. Look for a change in the pattern or not enough counts in 2 hours.  SEEK MEDICAL CARE IF:  · You feel less than 10 counts in 2 hours, tried twice.  · There is no movement in over an hour.  · The pattern is changing or taking longer each day to reach 10 counts in 2 hours.  · You feel the baby is not moving as he or she usually does.  Date: ____________ Movements: ____________ Start time: ____________ Finish time: ____________   Date: ____________ Movements: ____________ Start time: ____________ Finish time: ____________  Date: ____________ Movements: ____________ Start time: ____________ Finish time: ____________  Date: ____________ Movements:  ____________ Start time: ____________ Finish time: ____________  Date: ____________ Movements: ____________ Start time: ____________ Finish time: ____________  Date: ____________ Movements: ____________ Start time: ____________ Finish time: ____________  Date: ____________ Movements: ____________ Start time: ____________ Finish time: ____________  Date: ____________ Movements: ____________ Start time: ____________ Finish time: ____________   Date: ____________ Movements: ____________ Start time: ____________ Finish time: ____________  Date: ____________ Movements: ____________ Start time: ____________ Finish time: ____________  Date: ____________ Movements: ____________ Start time: ____________ Finish time: ____________  Date: ____________ Movements: ____________ Start time: ____________ Finish time: ____________  Date: ____________ Movements: ____________ Start time: ____________ Finish time: ____________  Date: ____________ Movements: ____________ Start time: ____________ Finish time: ____________  Date: ____________ Movements: ____________ Start time: ____________ Finish time: ____________   Date: ____________ Movements: ____________ Start time: ____________ Finish time: ____________  Date: ____________ Movements: ____________ Start time: ____________ Finish time: ____________  Date: ____________ Movements: ____________ Start time: ____________ Finish time: ____________  Date: ____________ Movements: ____________ Start time: ____________ Finish time: ____________  Date: ____________ Movements: ____________ Start time: ____________ Finish time: ____________  Date: ____________ Movements: ____________ Start time: ____________ Finish time: ____________  Date: ____________ Movements: ____________ Start time: ____________ Finish time: ____________   Date: ____________ Movements: ____________ Start time: ____________ Finish time: ____________  Date: ____________ Movements: ____________ Start time: ____________ Finish  time: ____________  Date: ____________ Movements: ____________ Start time: ____________ Finish time: ____________  Date: ____________ Movements: ____________ Start time:   ____________ Finish time: ____________  Date: ____________ Movements: ____________ Start time: ____________ Finish time: ____________  Date: ____________ Movements: ____________ Start time: ____________ Finish time: ____________  Date: ____________ Movements: ____________ Start time: ____________ Finish time: ____________   Date: ____________ Movements: ____________ Start time: ____________ Finish time: ____________  Date: ____________ Movements: ____________ Start time: ____________ Finish time: ____________  Date: ____________ Movements: ____________ Start time: ____________ Finish time: ____________  Date: ____________ Movements: ____________ Start time: ____________ Finish time: ____________  Date: ____________ Movements: ____________ Start time: ____________ Finish time: ____________  Date: ____________ Movements: ____________ Start time: ____________ Finish time: ____________  Date: ____________ Movements: ____________ Start time: ____________ Finish time: ____________   Date: ____________ Movements: ____________ Start time: ____________ Finish time: ____________  Date: ____________ Movements: ____________ Start time: ____________ Finish time: ____________  Date: ____________ Movements: ____________ Start time: ____________ Finish time: ____________  Date: ____________ Movements: ____________ Start time: ____________ Finish time: ____________  Date: ____________ Movements: ____________ Start time: ____________ Finish time: ____________  Date: ____________ Movements: ____________ Start time: ____________ Finish time: ____________  Date: ____________ Movements: ____________ Start time: ____________ Finish time: ____________   Date: ____________ Movements: ____________ Start time: ____________ Finish time: ____________  Date: ____________  Movements: ____________ Start time: ____________ Finish time: ____________  Date: ____________ Movements: ____________ Start time: ____________ Finish time: ____________  Date: ____________ Movements: ____________ Start time: ____________ Finish time: ____________  Date: ____________ Movements: ____________ Start time: ____________ Finish time: ____________  Date: ____________ Movements: ____________ Start time: ____________ Finish time: ____________  Date: ____________ Movements: ____________ Start time: ____________ Finish time: ____________   Date: ____________ Movements: ____________ Start time: ____________ Finish time: ____________  Date: ____________ Movements: ____________ Start time: ____________ Finish time: ____________  Date: ____________ Movements: ____________ Start time: ____________ Finish time: ____________  Date: ____________ Movements: ____________ Start time: ____________ Finish time: ____________  Date: ____________ Movements: ____________ Start time: ____________ Finish time: ____________  Date: ____________ Movements: ____________ Start time: ____________ Finish time: ____________     This information is not intended to replace advice given to you by your health care provider. Make sure you discuss any questions you have with your health care provider.     Document Released: 05/03/2006 Document Revised: 04/24/2014 Document Reviewed: 01/29/2012  Elsevier Interactive Patient Education ©2016 [employer]

## 2015-11-08 ENCOUNTER — Encounter: Payer: Commercial Managed Care - PPO | Admitting: Advanced Practice Midwife

## 2015-11-15 ENCOUNTER — Encounter (HOSPITAL_COMMUNITY): Payer: Self-pay | Admitting: *Deleted

## 2015-11-15 ENCOUNTER — Inpatient Hospital Stay (HOSPITAL_COMMUNITY)
Admission: AD | Admit: 2015-11-15 | Discharge: 2015-11-15 | Disposition: A | Discharge status: Home / Self Care | Payer: Commercial Managed Care - PPO | Source: Ambulatory Visit | Attending: Obstetrics & Gynecology | Admitting: Obstetrics & Gynecology

## 2015-11-15 DIAGNOSIS — Z3493 Encounter for supervision of normal pregnancy, unspecified, third trimester: Secondary | ICD-10-CM

## 2015-11-15 NOTE — MAU Note (Signed)
PT  SAYS SHE FEELS  PELVIC  PRESSURE  .  VE IN OFFICE  2 WEEKS A GO   WAS 1  CM.    DENIES HSV AND MRSA.   GBS-POSITIVE.

## 2015-11-15 NOTE — MAU Note (Signed)
Pt presents complaining of pelvic pain and pressure. Some contractions. Denies leaking or bleeding. Reports good fetal movement.

## 2015-11-17 ENCOUNTER — Inpatient Hospital Stay (HOSPITAL_COMMUNITY): Payer: Commercial Managed Care - PPO | Admitting: Anesthesiology

## 2015-11-17 ENCOUNTER — Encounter (HOSPITAL_COMMUNITY): Payer: Self-pay

## 2015-11-17 ENCOUNTER — Inpatient Hospital Stay (HOSPITAL_COMMUNITY)
Admission: AD | Admit: 2015-11-17 | Discharge: 2015-11-19 | DRG: 775 | Disposition: A | Discharge status: Home / Self Care | Payer: Commercial Managed Care - PPO | Source: Ambulatory Visit | Attending: Obstetrics & Gynecology | Admitting: Obstetrics & Gynecology

## 2015-11-17 DIAGNOSIS — Z3493 Encounter for supervision of normal pregnancy, unspecified, third trimester: Secondary | ICD-10-CM

## 2015-11-17 DIAGNOSIS — O99824 Streptococcus B carrier state complicating childbirth: Secondary | ICD-10-CM | POA: Diagnosis present

## 2015-11-17 DIAGNOSIS — Z3A40 40 weeks gestation of pregnancy: Secondary | ICD-10-CM

## 2015-11-17 DIAGNOSIS — IMO0001 Reserved for inherently not codable concepts without codable children: Secondary | ICD-10-CM

## 2015-11-17 LAB — CBC
HCT: 28.3 % — ABNORMAL LOW (ref 36.0–46.0)
Hemoglobin: 9.1 g/dL — ABNORMAL LOW (ref 12.0–15.0)
MCH: 24.1 pg — AB (ref 26.0–34.0)
MCHC: 32.2 g/dL (ref 30.0–36.0)
MCV: 74.9 fL — ABNORMAL LOW (ref 78.0–100.0)
PLATELETS: 142 10*3/uL — AB (ref 150–400)
RBC: 3.78 MIL/uL — AB (ref 3.87–5.11)
RDW: 15.6 % — AB (ref 11.5–15.5)
WBC: 9.6 10*3/uL (ref 4.0–10.5)

## 2015-11-17 LAB — TYPE AND SCREEN
ABO/RH(D): O POS
Antibody Screen: NEGATIVE

## 2015-11-17 MED ORDER — LIDOCAINE HCL (PF) 1 % IJ SOLN
30.0000 mL | INTRAMUSCULAR | Status: DC | PRN
  Filled 2015-11-17: qty 30

## 2015-11-17 MED ORDER — BENZOCAINE-MENTHOL 20-0.5 % EX AERO
1.0000 "application " | INHALATION_SPRAY | CUTANEOUS | Status: DC | PRN
  Administered 2015-11-17: 1 via TOPICAL
  Filled 2015-11-17: qty 56

## 2015-11-17 MED ORDER — DIPHENHYDRAMINE HCL 25 MG PO CAPS: 25.0000 mg | ORAL_CAPSULE | Freq: Four times a day (QID) | ORAL | Status: DC | PRN

## 2015-11-17 MED ORDER — OXYTOCIN BOLUS FROM INFUSION
500.0000 mL | Freq: Once | INTRAVENOUS | Status: AC
  Administered 2015-11-17: 500 mL via INTRAVENOUS

## 2015-11-17 MED ORDER — SIMETHICONE 80 MG PO CHEW: 80.0000 mg | CHEWABLE_TABLET | ORAL | Status: DC | PRN

## 2015-11-17 MED ORDER — ONDANSETRON HCL 4 MG PO TABS: 4.0000 mg | ORAL_TABLET | ORAL | Status: DC | PRN

## 2015-11-17 MED ORDER — ONDANSETRON HCL 4 MG/2ML IJ SOLN: 4.0000 mg | Freq: Four times a day (QID) | INTRAMUSCULAR | Status: DC | PRN

## 2015-11-17 MED ORDER — OXYTOCIN 40 UNITS IN LACTATED RINGERS INFUSION - SIMPLE MED
2.5000 [IU]/h | INTRAVENOUS | Status: DC
  Filled 2015-11-17 (×2): qty 1000

## 2015-11-17 MED ORDER — ACETAMINOPHEN 325 MG PO TABS
650.0000 mg | ORAL_TABLET | ORAL | Status: DC | PRN
  Administered 2015-11-17 – 2015-11-18 (×2): 650 mg via ORAL
  Filled 2015-11-17 (×2): qty 2

## 2015-11-17 MED ORDER — LACTATED RINGERS IV SOLN: INTRAVENOUS | Status: DC

## 2015-11-17 MED ORDER — SODIUM CHLORIDE 0.9 % IV SOLN
2.0000 g | Freq: Once | INTRAVENOUS | Status: AC
  Administered 2015-11-17: 2 g via INTRAVENOUS
  Filled 2015-11-17: qty 2000

## 2015-11-17 MED ORDER — IBUPROFEN 600 MG PO TABS
600.0000 mg | ORAL_TABLET | Freq: Four times a day (QID) | ORAL | Status: DC
  Administered 2015-11-17 – 2015-11-19 (×9): 600 mg via ORAL
  Filled 2015-11-17 (×9): qty 1

## 2015-11-17 MED ORDER — DIBUCAINE 1 % RE OINT: 1.0000 "application " | TOPICAL_OINTMENT | RECTAL | Status: DC | PRN

## 2015-11-17 MED ORDER — ONDANSETRON HCL 4 MG/2ML IJ SOLN: 4.0000 mg | INTRAMUSCULAR | Status: DC | PRN

## 2015-11-17 MED ORDER — FENTANYL 2.5 MCG/ML BUPIVACAINE 1/10 % EPIDURAL INFUSION (WH - ANES)
14.0000 mL/h | INTRAMUSCULAR | Status: DC | PRN
  Administered 2015-11-17: 14 mL/h via EPIDURAL
  Filled 2015-11-17: qty 125

## 2015-11-17 MED ORDER — PHENYLEPHRINE 40 MCG/ML (10ML) SYRINGE FOR IV PUSH (FOR BLOOD PRESSURE SUPPORT)
80.0000 ug | PREFILLED_SYRINGE | INTRAVENOUS | Status: DC | PRN
  Filled 2015-11-17: qty 5

## 2015-11-17 MED ORDER — TETANUS-DIPHTH-ACELL PERTUSSIS 5-2.5-18.5 LF-MCG/0.5 IM SUSP: 0.5000 mL | Freq: Once | INTRAMUSCULAR | Status: DC

## 2015-11-17 MED ORDER — SENNOSIDES-DOCUSATE SODIUM 8.6-50 MG PO TABS
2.0000 | ORAL_TABLET | ORAL | Status: DC
  Administered 2015-11-17 – 2015-11-18 (×2): 2 via ORAL
  Filled 2015-11-17 (×2): qty 2

## 2015-11-17 MED ORDER — FERROUS SULFATE 325 (65 FE) MG PO TBEC
325.0000 mg | DELAYED_RELEASE_TABLET | Freq: Every day | ORAL | Status: DC
  Filled 2015-11-17: qty 1

## 2015-11-17 MED ORDER — COCONUT OIL OIL: 1.0000 "application " | TOPICAL_OIL | Status: DC | PRN

## 2015-11-17 MED ORDER — PHENYLEPHRINE 40 MCG/ML (10ML) SYRINGE FOR IV PUSH (FOR BLOOD PRESSURE SUPPORT)
80.0000 ug | PREFILLED_SYRINGE | INTRAVENOUS | Status: DC | PRN
  Filled 2015-11-17: qty 5
  Filled 2015-11-17: qty 10

## 2015-11-17 MED ORDER — LIDOCAINE HCL (PF) 1 % IJ SOLN
INTRAMUSCULAR | Status: DC | PRN
  Administered 2015-11-17 (×2): 7 mL via EPIDURAL

## 2015-11-17 MED ORDER — WITCH HAZEL-GLYCERIN EX PADS
1.0000 "application " | MEDICATED_PAD | CUTANEOUS | Status: DC | PRN
  Administered 2015-11-17: 1 via TOPICAL

## 2015-11-17 MED ORDER — DIPHENHYDRAMINE HCL 50 MG/ML IJ SOLN: 12.5000 mg | INTRAMUSCULAR | Status: DC | PRN

## 2015-11-17 MED ORDER — LACTATED RINGERS IV SOLN
500.0000 mL | Freq: Once | INTRAVENOUS | Status: AC
  Administered 2015-11-17: 500 mL via INTRAVENOUS

## 2015-11-17 MED ORDER — OXYCODONE-ACETAMINOPHEN 5-325 MG PO TABS: 1.0000 | ORAL_TABLET | ORAL | Status: DC | PRN

## 2015-11-17 MED ORDER — FENTANYL CITRATE (PF) 100 MCG/2ML IJ SOLN: 50.0000 ug | INTRAMUSCULAR | Status: DC | PRN

## 2015-11-17 MED ORDER — ZOLPIDEM TARTRATE 5 MG PO TABS: 5.0000 mg | ORAL_TABLET | Freq: Every evening | ORAL | Status: DC | PRN

## 2015-11-17 MED ORDER — EPHEDRINE 5 MG/ML INJ
10.0000 mg | INTRAVENOUS | Status: DC | PRN
  Filled 2015-11-17: qty 4

## 2015-11-17 MED ORDER — OXYCODONE-ACETAMINOPHEN 5-325 MG PO TABS: 2.0000 | ORAL_TABLET | ORAL | Status: DC | PRN

## 2015-11-17 MED ORDER — LACTATED RINGERS IV SOLN: 500.0000 mL | INTRAVENOUS | Status: DC | PRN

## 2015-11-17 MED ORDER — SOD CITRATE-CITRIC ACID 500-334 MG/5ML PO SOLN: 30.0000 mL | ORAL | Status: DC | PRN

## 2015-11-17 MED ORDER — ACETAMINOPHEN 325 MG PO TABS: 650.0000 mg | ORAL_TABLET | ORAL | Status: DC | PRN

## 2015-11-17 MED ORDER — PRENATAL MULTIVITAMIN CH
1.0000 | ORAL_TABLET | Freq: Every day | ORAL | Status: DC
  Administered 2015-11-18 – 2015-11-19 (×2): 1 via ORAL
  Filled 2015-11-17 (×2): qty 1

## 2015-11-17 NOTE — Lactation Note (Signed)
This note was copied from a baby's chart. Lactation Consultation Note  Patient Name: [name] [id number] Date: 11/17/2015 Reason for consult: Initial assessment Baby at 6 hr of life. Mom did not bf her other children because she has "inverted" nipples. Her nipples look flat with and inverted center. With stimulation her nipples do become more erect but the inverted nipple face remains. She tried [place] and did not get any milk so she thinks she is not making anything right now. Tried a #20 NS but the baby was not interested in latching so she got discouraged and stated she was going to bottle feed. After talking about normal baby behavior and pumping to feed mom stated she will not give up yet. She will continue to try latching but if it he does not get the hang of it soon she will got to formula bottles until her milk comes in. Discussed baby behavior, feeding frequency, baby belly size, voids, wt loss, breast changes, and nipple care. She stated she can manually express and has spoon in room. Given lactation handouts. Aware of OP services and support group.    Maternal Data    Feeding Feeding Type: Breast Fed Length of feed: 0 min  LATCH Score/Interventions Latch: Too sleepy or reluctant, no latch achieved, no sucking elicited. Intervention(s): Skin to skin;Teach feeding cues  Audible Swallowing: None Intervention(s): Skin to skin;Hand expression  Type of Nipple: Flat Intervention(s): Shells  Comfort (Breast/Nipple): Soft / non-tender     Hold (Positioning): Full assist, staff holds infant at breast Intervention(s): Support Pillows;Position options  LATCH Score: 3  Lactation Tools Discussed/Used WIC Program: No Pump Review: Setup, frequency, and cleaning;Milk Storage Date initiated:: 11/17/15   Consult Status Consult Status: Follow-up Date: 11/18/15 Follow-up type: In-patient    Rulon Eisenmenger 11/17/2015, 4:40 PM

## 2015-11-17 NOTE — H&P (Signed)
LABOR ADMISSION HISTORY AND PHYSICAL  [name] is a 35 y.o. female [phone] with IUP at [redacted]w[redacted]d by LMP (confirmed by 22 wk [place]) presenting for active laboring. She reports +FM, + contractions, No LOF, no VB, no blurry vision, headaches and RUQ pain; + peripheral edema for last few weeks.  She plans on breastfeeding. She request Nexplanon for birth control.   Prenatal History/Complications:  Past Medical History: Past Medical History:  Diagnosis Date  . Abnormal pap    cryo  . Anemia   . Chest pain   . H/O anxiety disorder   . Vaginal Pap smear, abnormal     Past Surgical History: Past Surgical History:  Procedure Laterality Date  . cervical cryo    . WISDOM TOOTH EXTRACTION      Obstetrical History: OB History    Gravida Para Term Preterm AB Living   SAB TAB Ectopic Multiple Live Births           3      Social History: Social History   Social History  . Marital status: Single    Spouse name: N/A  . Number of children: 3  . Years of education: N/A   Occupational History  . Pharmacy Tech.     Social History Main Topics  . Smoking status: Never Smoker  . Smokeless tobacco: Never Used  . Alcohol use 0.0 oz/week     Comment: socially  . Drug use: No  . Sexual activity: Yes    Partners: Male    Birth control/ protection: None     Comment: single, 3 kids, single mom, no regular exercise   Other Topics Concern  . None   Social History Narrative   1-2 caffeinated drinks per day. No regular exercise.      Family History: Family History  Problem Relation Age of Onset  . Hypertension Mother   . Heart failure Mother   . Diabetes Mother   . Hypertension Father   . Diabetes Maternal Aunt   . Hyperlipidemia      family history    Allergies: No Known Allergies  Prescriptions Prior to Admission  Medication Sig Dispense Refill Last Dose  . ferrous sulfate 325 (65 FE) MG EC tablet Take 325 mg by mouth daily with breakfast.   Past Month at  Unknown time  . Prenatal Vit-Fe Fumarate-FA (MULTIVITAMIN-PRENATAL) 27-0.8 MG TABS tablet Take 1 tablet by mouth daily at 12 noon.   11/16/2015 at Unknown time  . Misc. Devices (BREAST PUMP) MISC Dispense one breast pump for patient 1 each 0 Taking     Review of Systems   All systems reviewed and negative except as stated in HPI  BP (!) 146/73   Pulse 64   Temp 99.4 F (37.4 C) (Oral)   Resp 19   Ht  (1.702 m)   Wt 91.2 kg (201 lb)   LMP 02/08/2015   SpO2 98%   BMI 31.48 kg/m  General appearance: alert, cooperative and no distress Lungs: clear to auscultation bilaterally Heart: regular rate and rhythm Abdomen: soft, non-tender; bowel sounds normal Extremities: Homans sign is negative, no sign of DVT; + edema Presentation: cephalic Fetal monitoringBaseline: 135 bpm, Variability: Good {> 6 bpm), Accelerations: Reactive and Decelerations: Early Uterine activity: Frequency: Every 2-4 minutes Dilation: 7 Effacement (%): 90 Station: -2 Exam by:: Sarajane Marek, RNC   Prenatal labs: ABO, Rh: O/POS/-- (12/29 1500) Antibody: NEG (12/29 1500)  Rubella: !Error! RPR: NON REAC (06/15 1344)  HBsAg: NEGATIVE (12/29 1500)  HIV: NONREACTIVE (06/15 1344)  GBS: Positive (07/12 0000)  1 hr Glucola: passed 88 Genetic screening: neg quad Anatomy US: wnl, female  Prenatal Transfer Tool  Maternal Diabetes: No Genetic Screening: Normal Maternal Ultrasounds/Referrals: Normal Fetal Ultrasounds or other Referrals:  None Maternal Substance Abuse:  No Significant Maternal Medications:  None Significant Maternal Lab Results: Lab values include: Group B Strep positive, Other:   Results for orders placed or performed during the hospital encounter of 11/17/15 (from the past 24 hour(s))  CBC   Collection Time: 11/17/15  8:40 AM  Result Value Ref Range   WBC 9.6 4.0 - 10.5 K/uL   RBC 3.78 (L) 3.87 - 5.11 MIL/uL   Hemoglobin 9.1 (L) 12.0 - 15.0 g/dL   HCT 44.9 (L) 20.1 - 00.7 %   MCV 74.9 (L)  78.0 - 100.0 fL   MCH 24.1 (L) 26.0 - 34.0 pg   MCHC 32.2 30.0 - 36.0 g/dL   RDW 12.1 (H) 97.5 - 88.3 %   Platelets 142 (L) 150 - 400 K/uL    Patient Active Problem List   Diagnosis Date Noted  . Active labor at term 11/17/2015  . Supervision of normal pregnancy 04/15/2015  . ANXIETY DISORDER, GENERALIZED 12/28/2009  . FATIGUE 08/31/2009  . HEADACHE 05/06/2009  . PAP SMEAR, LGSIL, ABNORMAL 11/20/2008  . Anemia 11/18/2008  . INSOMNIA 06/30/2008    Assessment: [name] is a 35 y.o. G4P3003 at [redacted]w[redacted]d here for active laboring.  #Labor: expectant management #Pain: Epidural placed #FWB:  cat 1 #ID:  GBS pos > amp #MOF: breast #MOC:nexplanon #Circ:  yes  Jaci Lazier, Medical Student   I saw this patient with the medical student and have added my findings to the above H&P.  Loni Muse, MD   CNM attestation:  I have seen and examined this patient.   [name] is a 35 y.o. [id number] here for active labor.  PE: BP 132/64 (BP Location: Left Arm)   Pulse 61   Temp 98.5 F (36.9 C) (Oral)   Resp 18   Ht 5\' 7"  (1.702 m)   Wt 91.2 kg (201 lb)   LMP 02/08/2015   SpO2 100%   Breastfeeding? Unknown   BMI 31.48 kg/m  Gen: calm comfortable, NAD Resp: normal effort, no distress Abd: gravid  ROS, labs, PMH reviewed  Plan: Admit to [facility] Expectant management Amp for GBS ppx Anticipate SVD  Sheryl Towell CNM 11/17/2015, 6:04 PM

## 2015-11-17 NOTE — Anesthesia Pain Management Evaluation Note (Signed)
  CRNA Pain Management Visit Note  Patient: [name], 35 y.o., female  "Hello I am a member of the anesthesia team at [facility]. We have an anesthesia team available at all times to provide care throughout the hospital, including epidural management and anesthesia for C-section. I don't know your plan for the delivery whether it a natural birth, water birth, IV sedation, nitrous supplementation, doula or epidural, but we want to meet your pain goals."   1.Was your pain managed to your expectations on prior hospitalizations?   Yes   2.What is your expectation for pain management during this hospitalization?     Epidural  3.How can we help you reach that goal?   Record the patient's initial score and the patient's pain goal.   Pain: 5  Pain Goal: 5 [facility] wants you to be able to say your pain was always managed very well.  Cephus Shelling 11/17/2015

## 2015-11-17 NOTE — Progress Notes (Signed)
MOB was referred for history of /anxiety.  Referral is screened out by Clinical Social Worker because none of the following criteria appear to apply and  there are no reports impacting the pregnancy or her transition to the postpartum period. CSW does not deem it clinically necessary to further investigate at this time.   -History of anxiety/depression during this pregnancy, or of post-partum depression. - Diagnosis of anxiety and/or depression within last 3 years.  MOB dx in 2012/2013, was on medication, but stopped end of 2013/2014 with no complications or stressors. - History of depression due to pregnancy loss/loss of child or -MOB's symptoms are currently being treated with medication and/or therapy.  Please contact the Clinical Social Worker if needs arise or upon MOB request.      Contact CSW as needs arise or upon MOB request. Doni Bacha, LCSW Clinical Social Worker [phone] 

## 2015-11-17 NOTE — Anesthesia Postprocedure Evaluation (Signed)
Anesthesia Post Note  Patient: [name]  Procedure(s) Performed: * No procedures listed *  Patient location during evaluation: Mother Baby Anesthesia Type: Epidural Level of consciousness: awake, awake and alert, oriented and patient cooperative Pain management: pain level controlled Vital Signs Assessment: post-procedure vital signs reviewed and stable Respiratory status: spontaneous breathing, nonlabored ventilation and respiratory function stable Cardiovascular status: stable Postop Assessment: no headache, no backache, no signs of nausea or vomiting and patient able to bend at knees Anesthetic complications: no     Last Vitals:  Vitals:   11/17/15 1215 11/17/15 1235  BP: 108/86 (!) 136/93  Pulse: (!) 57 (!) 55  Resp: 16 16  Temp:  36.8 C    Last Pain:  Vitals:   11/17/15 1235  TempSrc: Oral  PainSc:    Pain Goal: Patients Stated Pain Goal: 5 (11/17/15 0947)               Gerardine Peltz L

## 2015-11-17 NOTE — Anesthesia Procedure Notes (Signed)
Epidural Patient location during procedure: OB Start time: 11/17/2015 9:18 AM End time: 11/17/2015 9:22 AM  Staffing Anesthesiologist: Leilani Able  Preanesthetic Checklist Completed: patient identified, surgical consent, pre-op evaluation, timeout performed, IV checked, risks and benefits discussed and monitors and equipment checked  Epidural Patient position: sitting Prep: site prepped and draped and DuraPrep Patient monitoring: continuous pulse ox and blood pressure Approach: midline Location: L3-L4 Injection technique: LOR air  Needle:  Needle type: Tuohy  Needle gauge: 17 G Needle length: 9 cm and 9 Needle insertion depth: 5 cm cm Catheter type: closed end flexible Catheter size: 19 Gauge Catheter at skin depth: 10 cm Test dose: negative and Other  Assessment Sensory level: T9 Events: blood not aspirated, injection not painful, no injection resistance, negative IV test and no paresthesia  Additional Notes Reason for block:procedure for pain

## 2015-11-17 NOTE — MAU Note (Signed)
Pt c/o contractions. Denies LOF or vag bleeding. +FM. Was 1cm on Sunday.

## 2015-11-17 NOTE — Anesthesia Preprocedure Evaluation (Signed)
Anesthesia Evaluation  Patient identified by MRN, date of birth, ID band Patient awake    Reviewed: Allergy & Precautions, Patient's Chart, lab work & pertinent test results  Airway Mallampati: II  TM Distance: >3 FB Neck ROM: full    Dental no notable dental hx.    Pulmonary neg pulmonary ROS,    Pulmonary exam normal breath sounds clear to auscultation       Cardiovascular negative cardio ROS Normal cardiovascular exam     Neuro/Psych negative neurological ROS  negative psych ROS   GI/Hepatic negative GI ROS, Neg liver ROS,   Endo/Other  negative endocrine ROS  Renal/GU negative Renal ROS     Musculoskeletal   Abdominal (+) + obese,   Peds  Hematology negative hematology ROS (+)   Anesthesia Other Findings   Reproductive/Obstetrics (+) Pregnancy                             Anesthesia Physical Anesthesia Plan  ASA: II  Anesthesia Plan: Epidural   Post-op Pain Management:    Induction:   Airway Management Planned:   Additional Equipment:   Intra-op Plan:   Post-operative Plan:   Informed Consent: I have reviewed the patients History and Physical, chart, labs and discussed the procedure including the risks, benefits and alternatives for the proposed anesthesia with the patient or authorized representative who has indicated his/her understanding and acceptance.     Plan Discussed with:   Anesthesia Plan Comments:         Anesthesia Quick Evaluation

## 2015-11-17 NOTE — MAU Note (Signed)
Recheck in 1 hour

## 2015-11-18 ENCOUNTER — Encounter (HOSPITAL_COMMUNITY): Payer: Self-pay

## 2015-11-18 LAB — RPR: RPR: NONREACTIVE

## 2015-11-18 MED ORDER — FERROUS SULFATE 325 (65 FE) MG PO TABS
325.0000 mg | ORAL_TABLET | Freq: Every day | ORAL | Status: DC
  Administered 2015-11-19: 325 mg via ORAL
  Filled 2015-11-18: qty 1

## 2015-11-18 NOTE — Progress Notes (Signed)
Post Partum Day #1 Subjective: no complaints, up ad lib and tolerating PO; bottlefeeding  Objective: Blood pressure (!) 106/55, pulse (!) 57, temperature 98 F (36.7 C), resp. rate 19, height 5\' 7"  (1.702 m), weight 91.2 kg (201 lb), last menstrual period 02/08/2015, SpO2 100 %, unknown if currently breastfeeding.  Physical Exam:  General: alert, cooperative and no distress Lochia: appropriate Uterine Fundus: firm DVT Evaluation: No evidence of DVT seen on physical exam.   Recent Labs  11/17/15 0840  HGB 9.1*  HCT 28.3*    Assessment/Plan: Plan for discharge tomorrow, Circumcision prior to discharge and Contraception Nexplanon   LOS: 1 day   Cam Hai CNM 11/18/2015, 8:58 AM

## 2015-11-19 MED ORDER — IBUPROFEN 600 MG PO TABS: 600.0000 mg | ORAL_TABLET | Freq: Four times a day (QID) | ORAL | 0 refills | Status: DC | PRN

## 2015-11-19 NOTE — Discharge Summary (Signed)
OB Discharge Summary     Patient Name: [name] DOB: 1980/11/19 MRN: [id number]  Date of admission: 11/17/2015 Delivering MD: Garth Bigness   Date of discharge: 11/19/2015  Admitting diagnosis: 40 WEEKS CTX Intrauterine pregnancy: [redacted]w[redacted]d     Secondary diagnosis:  Active Problems:   Active labor at term  Additional problems: none     Discharge diagnosis: Term Pregnancy Delivered                                                                                                Post partum procedures:none  Augmentation: none  Complications: None  Hospital course:  Onset of Labor With Vaginal Delivery     35 y.o. yo [id number] at [redacted]w[redacted]d was admitted in Active Labor on 11/17/2015. She received one dose of Amp for GBS ppx prior to delivery. Patient had an uncomplicated labor course as follows:  Membrane Rupture Time/Date: 10:20 AM ,11/17/2015   Intrapartum Procedures: Episiotomy: None [1]                                         Lacerations:  None [1]  Patient had a delivery of a Viable infant. 11/17/2015  Information for the patient's newborn:  Valia, Thurner [[id number]]  Delivery Method: Vaginal, Spontaneous Delivery (Filed from Delivery Summary)    Pateint had an uncomplicated postpartum course.  She is ambulating, tolerating a regular diet, passing flatus, and urinating well. Patient is discharged home in stable condition on 11/19/15.    Physical exam Vitals:   11/17/15 2223 11/18/15 0527 11/18/15 1826 11/19/15 0500  BP: (!) 119/59 (!) 106/55 131/75 130/75  Pulse: (!) 54 (!) 57 66 70  Resp: 18 19 18 16   Temp: 98.3 F (36.8 C) 98 F (36.7 C) 98 F (36.7 C) 98.1 F (36.7 C)  TempSrc: Oral  Oral Oral  SpO2:      Weight:      Height:       General: alert and cooperative Lochia: appropriate Uterine Fundus: firm Incision: N/A DVT Evaluation: No evidence of DVT seen on physical exam. Labs: Lab Results  Component Value Date   WBC 9.6 11/17/2015   HGB 9.1 (L)  11/17/2015   HCT 28.3 (L) 11/17/2015   MCV 74.9 (L) 11/17/2015   PLT 142 (L) 11/17/2015   CMP Latest Ref Rng & Units 02/15/2015  Glucose 65 - 99 mg/dL 82  BUN 7 - 25 mg/dL 17  Creatinine 3.81 - 0.17 mg/dL 5.10  Sodium 258 - 527 mmol/L 139  Potassium 3.5 - 5.3 mmol/L 3.9  Chloride 98 - 110 mmol/L 104  CO2 20 - 31 mmol/L 23  Calcium 8.6 - 10.2 mg/dL 8.8  Total Protein 6.1 - 8.1 g/dL 7.3  Total Bilirubin 0.2 - 1.2 mg/dL 0.5  Alkaline Phos 33 - 115 U/L 53  AST 10 - 30 U/L 18  ALT 6 - 29 U/L 11    Discharge instruction: per After Visit Summary and "Baby and Me Booklet".  After  visit meds:    Medication List    TAKE these medications   Breast Pump Misc Dispense one breast pump for patient   ferrous sulfate 325 (65 FE) MG EC tablet Take 325 mg by mouth daily with breakfast.   ibuprofen 600 MG tablet Commonly known as:  ADVIL,MOTRIN Take 1 tablet (600 mg total) by mouth every 6 (six) hours as needed.   multivitamin-prenatal 27-0.8 MG Tabs tablet Take 1 tablet by mouth daily at 12 noon.       Diet: routine diet  Activity: Advance as tolerated. Pelvic rest for 6 weeks.   Outpatient follow up:6 weeks Follow up Appt:No future appointments. Follow up Visit:No Follow-up on file.  Postpartum contraception: Nexplanon  Newborn Data: Live born female  Birth Weight: 8 lb 0.4 oz (3640 g) APGAR: 9, 9  Baby Feeding: Bottle Disposition:home with mother   11/19/2015 Cam Hai, CNM  8:02 AM

## 2015-11-19 NOTE — Discharge Instructions (Signed)

## 2015-11-23 NOTE — Progress Notes (Signed)
Post discharge chart review completed.  

## 2015-11-27 DIAGNOSIS — O1495 Unspecified pre-eclampsia, complicating the puerperium: Secondary | ICD-10-CM

## 2015-11-27 HISTORY — DX: Unspecified pre-eclampsia, complicating the puerperium: O14.95

## 2015-12-08 ENCOUNTER — Ambulatory Visit: Payer: Commercial Managed Care - PPO | Admitting: Obstetrics & Gynecology

## 2015-12-10 ENCOUNTER — Ambulatory Visit: Payer: Commercial Managed Care - PPO | Admitting: Family

## 2016-01-03 ENCOUNTER — Ambulatory Visit: Payer: Commercial Managed Care - PPO | Admitting: Certified Nurse Midwife

## 2016-01-10 ENCOUNTER — Ambulatory Visit: Payer: Commercial Managed Care - PPO | Admitting: Obstetrics & Gynecology

## 2016-01-11 ENCOUNTER — Encounter: Payer: Self-pay | Admitting: Obstetrics & Gynecology

## 2016-01-11 ENCOUNTER — Ambulatory Visit (INDEPENDENT_AMBULATORY_CARE_PROVIDER_SITE_OTHER): Payer: Commercial Managed Care - PPO | Admitting: Obstetrics & Gynecology

## 2016-01-11 ENCOUNTER — Encounter: Payer: Self-pay | Admitting: *Deleted

## 2016-01-11 DIAGNOSIS — Z3042 Encounter for surveillance of injectable contraceptive: Secondary | ICD-10-CM

## 2016-01-11 DIAGNOSIS — O1495 Unspecified pre-eclampsia, complicating the puerperium: Secondary | ICD-10-CM

## 2016-01-11 DIAGNOSIS — Z30013 Encounter for initial prescription of injectable contraceptive: Secondary | ICD-10-CM

## 2016-01-11 MED ORDER — MEDROXYPROGESTERONE ACETATE 150 MG/ML IM SUSP
150.0000 mg | INTRAMUSCULAR | Status: DC
  Administered 2016-01-11: 150 mg via INTRAMUSCULAR

## 2016-01-11 MED ORDER — NORGESTIMATE-ETH ESTRADIOL 0.25-35 MG-MCG PO TABS: 1.0000 | ORAL_TABLET | Freq: Every day | ORAL | 11 refills | Status: DC

## 2016-01-11 NOTE — Patient Instructions (Signed)
Oral Contraception Use Oral contraceptive pills (OCPs) are medicines taken to prevent pregnancy. OCPs work by preventing the ovaries from releasing eggs. The hormones in OCPs also cause the cervical mucus to thicken, preventing the sperm from entering the uterus. The hormones also cause the uterine lining to become thin, not allowing a fertilized egg to attach to the inside of the uterus. OCPs are highly effective when taken exactly as prescribed. However, OCPs do not prevent sexually transmitted diseases (STDs). Safe sex practices, such as using condoms along with an OCP, can help prevent STDs. Before taking OCPs, you may have a physical exam and Pap test. Your health care provider may also order blood tests if necessary. Your health care provider will make sure you are a good candidate for oral contraception. Discuss with your health care provider the possible side effects of the OCP you may be prescribed. When starting an OCP, it can take 2 to 3 months for the body to adjust to the changes in hormone levels in your body.  HOW TO TAKE ORAL CONTRACEPTIVE PILLS Your health care provider may advise you on how to start taking the first cycle of OCPs. Otherwise, you can:   Start on day 1 of your menstrual period. You will not need any backup contraceptive protection with this start time.   Start on the first Sunday after your menstrual period or the day you get your prescription. In these cases, you will need to use backup contraceptive protection for the first week.   Start the pill at any time of your cycle. If you take the pill within 5 days of the start of your period, you are protected against pregnancy right away. In this case, you will not need a backup form of birth control. If you start at any other time of your menstrual cycle, you will need to use another form of birth control for 7 days. If your OCP is the type called a minipill, it will protect you from pregnancy after taking it for 2 days (48  hours). After you have started taking OCPs:   If you forget to take 1 pill, take it as soon as you remember. Take the next pill at the regular time.   If you miss 2 or more pills, call your health care provider because different pills have different instructions for missed doses. Use backup birth control until your next menstrual period starts.   If you use a 28-day pack that contains inactive pills and you miss 1 of the last 7 pills (pills with no hormones), it will not matter. Throw away the rest of the non-hormone pills and start a new pill pack.  No matter which day you start the OCP, you will always start a new pack on that same day of the week. Have an extra pack of OCPs and a backup contraceptive method available in case you miss some pills or lose your OCP pack.  HOME CARE INSTRUCTIONS   Do not smoke.   Always use a condom to protect against STDs. OCPs do not protect against STDs.   Use a calendar to mark your menstrual period days.   Read the information and directions that came with your OCP. Talk to your health care provider if you have questions.  SEEK MEDICAL CARE IF:   You develop nausea and vomiting.   You have abnormal vaginal discharge or bleeding.   You develop a rash.   You miss your menstrual period.   You are losing   your hair.   You need treatment for mood swings or depression.   You get dizzy when taking the OCP.   You develop acne from taking the OCP.   You become pregnant.  SEEK IMMEDIATE MEDICAL CARE IF:   You develop chest pain.   You develop shortness of breath.   You have an uncontrolled or severe headache.   You develop numbness or slurred speech.   You develop visual problems.   You develop pain, redness, and swelling in the legs.    This information is not intended to replace advice given to you by your health care provider. Make sure you discuss any questions you have with your health care provider.   Document  Released: 03/23/2011 Document Revised: 04/24/2014 Document Reviewed: 09/22/2012 Elsevier Interactive Patient Education 2016 Elsevier Inc.  

## 2016-01-11 NOTE — Progress Notes (Signed)
Post Partum Exam  Rondall Allegraeshaun L Menton is a 35 y.o. [id number] female who presents for a postpartum visit. She is 6 weeks  postpartum following a SVD. I have fully reviewed the prenatal and intrapartum course. The delivery was at 40 gestational weeks.  Anesthesia: epidural. Postpartum course has been complicated by developing severe postpartum preeclampsia; she was [facility] on PPD#7 for severe headaches and BP 230s/100s.  She was admitted for three days, discharged on Procardia. She reports not taking Procardia as instructed, no current symptoms.  Baby's course has been unremarkable. Baby is feeding by bottle Bleeding none. Bowel function is normal. Bladder function is normal. Patient is not sexually active. Contraception method: would like to discuss getting on the pill. Postpartum depression screening: negative.  The following portions of the patient's history were reviewed and updated as appropriate: allergies, current medications, past family history, past medical history, past social history, past surgical history and problem list.  Normal pap in 02/15/15.  Review of Systems Pertinent items noted in HPI and remainder of comprehensive ROS otherwise negative.   Objective:   Blood pressure (!) 163/79, pulse 60, height 5\' 7"  (1.702 m), weight 182 lb (82.6 kg), not currently breastfeeding.  General:  alert and no distress   Breasts:  deferred  Lungs: clear to auscultation bilaterally  Heart:  regular rate and rhythm  Abdomen: soft, non-tender; bowel sounds normal; no masses,  no organomegaly  Pelvic:  not evaluated        Assessment:   Normal postpartum exam. Pap smear not done at today's visit.  Postpartum preeclampsia  Plan:  1. Postpartum preeclampsia: Patient was told to take medication as prescribed and follow up next week with PCP. Also told to go to ER for any symptoms.  2. Contraception: Not a candidate for OCPs given BP. Counseled about progestin only  methods. Will get Depo Provera.  2. Follow up in: 3 months for repeat Depo Provera or as needed.   Jaynie CollinsUGONNA  Stein Windhorst, MD, FACOG Attending Obstetrician & Gynecologist, [facility], [facility]

## 2016-01-12 ENCOUNTER — Encounter: Payer: Self-pay | Admitting: *Deleted

## 2016-01-26 ENCOUNTER — Ambulatory Visit (INDEPENDENT_AMBULATORY_CARE_PROVIDER_SITE_OTHER): Payer: Commercial Managed Care - PPO | Admitting: Family Medicine

## 2016-01-26 ENCOUNTER — Encounter: Payer: Self-pay | Admitting: Family Medicine

## 2016-01-26 VITALS — BP 133/66 | HR 67 | Wt 184.0 lb

## 2016-01-26 DIAGNOSIS — Z0000 Encounter for general adult medical examination without abnormal findings: Secondary | ICD-10-CM | POA: Diagnosis not present

## 2016-01-26 DIAGNOSIS — B360 Pityriasis versicolor: Secondary | ICD-10-CM | POA: Diagnosis not present

## 2016-01-26 DIAGNOSIS — O1495 Unspecified pre-eclampsia, complicating the puerperium: Secondary | ICD-10-CM

## 2016-01-26 LAB — CBC
HCT: 31.9 % — ABNORMAL LOW (ref 35.0–45.0)
Hemoglobin: 10.2 g/dL — ABNORMAL LOW (ref 11.7–15.5)
MCH: 23.7 pg — ABNORMAL LOW (ref 27.0–33.0)
MCHC: 32 g/dL (ref 32.0–36.0)
MCV: 74.2 fL — ABNORMAL LOW (ref 80.0–100.0)
MPV: 10.4 fL (ref 7.5–12.5)
PLATELETS: 320 10*3/uL (ref 140–400)
RBC: 4.3 MIL/uL (ref 3.80–5.10)
RDW: 17.2 % — AB (ref 11.0–15.0)
WBC: 8.2 10*3/uL (ref 3.8–10.8)

## 2016-01-26 MED ORDER — NIFEDIPINE ER OSMOTIC RELEASE 90 MG PO TB24: 90.0000 mg | ORAL_TABLET | Freq: Every day | ORAL | 1 refills | Status: DC

## 2016-01-26 MED ORDER — FLUCONAZOLE 150 MG PO TABS: 300.0000 mg | ORAL_TABLET | ORAL | 0 refills | Status: DC

## 2016-01-26 NOTE — Progress Notes (Signed)
[name] is a 35 y.o. female who [facility] [place] [facility] today for Well adult. Patient is here today for wellness visit. She recently had a child via vaginal delivery. This was her  Fourth baby. Her was partum care was complicated by postpartum preeclampsia requiring hospitalization. She was discharged on nifedipine. She's been taking nifedipine daily and feels fine with no sluggishness headache leg swelling fevers or chills. She is using Depo-Provera injections for contraception but thinks she is done having children and is interested in more long-term options including IUD.  She is not currently breast-feeding.  She does however complain of some rash. She notes small flat hypopigmented circular skin lesions on her chest for the last several months. She has not tried any treatment yet.   Past Medical History:  Diagnosis Date  . Abnormal pap    cryo  . Anemia   . Chest pain   . H/O anxiety disorder   . Preeclampsia in postpartum period 11/27/2015  . Vaginal Pap smear, abnormal    Past Surgical History:  Procedure Laterality Date  . cervical cryo    . WISDOM TOOTH EXTRACTION     Social History  Substance Use Topics  . Smoking status: Never Smoker  . Smokeless tobacco: Never Used  . Alcohol use 0.0 oz/week     Comment: socially   family history includes Diabetes in her maternal aunt and mother; Heart failure in her mother; Hypertension in her father and mother.  ROS as above:  Medications: Current Outpatient Prescriptions  Medication Sig Dispense Refill  . ferrous sulfate 325 (65 FE) MG EC tablet Take 325 mg by mouth daily with breakfast.    . fluconazole (DIFLUCAN) 150 MG tablet Take 2 tablets (300 mg total) by mouth once a week. 4 tablet 0  . NIFEdipine (PROCARDIA XL) 90 MG 24 hr tablet Take 1 tablet (90 mg total) by mouth daily. 90 tablet 1  .  Prenatal Vit-Fe Fumarate-FA (MULTIVITAMIN-PRENATAL) 27-0.8 MG TABS tablet Take 1 tablet by mouth daily at 12 noon.     Current Facility-Administered Medications  Medication Dose Route Frequency Provider Last Rate Last Dose  . medroxyPROGESTERone (DEPO-PROVERA) injection 150 mg  150 mg Intramuscular Q90 days Tereso NewcomerUgonna A Anyanwu, MD   150 mg at 01/11/16 1510   No Known Allergies   Exam:  BP 133/66   Pulse 67   Wt 184 lb (83.5 kg)   BMI 28.82 kg/m  Gen: Well NAD nontoxic appearing HEENT: EOMI,  MMM no goiter Lungs: Normal work of breathing. CTABL Heart: RRR no MRG Abd: NABS, Soft. Nondistended, Nontender Exts: Brisk capillary refill, warm and well perfused.  Skin: several small circular macular hypopigmented skin lesions present on chest and neck and face. No scale present.  Depression screen PHQ 2/9 01/26/2016  Decreased Interest 0  Down, Depressed, Hopeless 0  PHQ - 2 Score 0    No results found for this or any previous visit (from the past 24 hour(s)). No results found.    Assessment and Plan: 35 y.o. female with  Well adult. Fasting labs obtained. Recent history  significant for postpartum preeclampsia. We'll continue nifedipine for 3-6 months in follow-up with PCP to see if she still needs to be on blood pressure medications. Tinea Versicolor present. Treat with fluconazole. Not currently breast feeding.    Orders Placed This Encounter  Procedures  . CBC  . COMPLETE METABOLIC PANEL WITH GFR  .  Hemoglobin A1c  . Lipid panel  . TSH  . VITAMIN D 25 Hydroxy (Vit-D Deficiency, Fractures)    Discussed warning signs or symptoms. Please see discharge instructions. Patient expresses understanding.

## 2016-01-26 NOTE — Patient Instructions (Signed)
Thank you for coming in today. Return to Dr Judie PetitM in 3-6 months.  Continue Procardia.  Get labs today.

## 2016-01-27 ENCOUNTER — Encounter: Payer: Self-pay | Admitting: Family Medicine

## 2016-01-27 DIAGNOSIS — E559 Vitamin D deficiency, unspecified: Secondary | ICD-10-CM | POA: Insufficient documentation

## 2016-01-27 LAB — COMPLETE METABOLIC PANEL WITH GFR
ALT: 10 U/L (ref 6–29)
AST: 13 U/L (ref 10–30)
Albumin: 3.9 g/dL (ref 3.6–5.1)
Alkaline Phosphatase: 82 U/L (ref 33–115)
BUN: 15 mg/dL (ref 7–25)
CHLORIDE: 106 mmol/L (ref 98–110)
CO2: 22 mmol/L (ref 20–31)
CREATININE: 0.68 mg/dL (ref 0.50–1.10)
Calcium: 8.7 mg/dL (ref 8.6–10.2)
GFR, Est African American: >89 mL/min (ref >=60)
GFR, Est Non African American: >89 mL/min (ref >=60)
Glucose, Bld: 88 mg/dL (ref 65–99)
POTASSIUM: 3.9 mmol/L (ref 3.5–5.3)
Sodium: 139 mmol/L (ref 135–146)
Total Bilirubin: 0.3 mg/dL (ref 0.2–1.2)
Total Protein: 7 g/dL (ref 6.1–8.1)

## 2016-01-27 LAB — TSH: TSH: 0.84 m[IU]/L

## 2016-01-27 LAB — LIPID PANEL
CHOL/HDL RATIO: 3.1 ratio (ref <=5.0)
Cholesterol: 183 mg/dL (ref 125–200)
HDL: 60 mg/dL (ref >=46)
LDL Cholesterol: 111 mg/dL (ref <130)
TRIGLYCERIDES: 62 mg/dL (ref <150)
VLDL: 12 mg/dL (ref <30)

## 2016-01-27 LAB — HEMOGLOBIN A1C
Hgb A1c MFr Bld: 5.5 % (ref <5.7)
MEAN PLASMA GLUCOSE: 111 mg/dL

## 2016-01-27 LAB — VITAMIN D 25 HYDROXY (VIT D DEFICIENCY, FRACTURES): VIT D 25 HYDROXY: 14 ng/mL — AB (ref 30–100)

## 2016-03-06 ENCOUNTER — Telehealth: Payer: Self-pay | Admitting: Family Medicine

## 2016-03-06 NOTE — Telephone Encounter (Signed)
Patient called adv that she had a physical and a form completed on 01/26/16 and was adv that it would be faxed over to [employer] which had a fax number on the form and she is calling stating they have not received the form from our office. Patient request to know what happened to the form and can it be re-faxed to her insurance company. Thanks

## 2016-03-06 NOTE — Telephone Encounter (Signed)
Call pt. Updated contact information and requested a new from be faxed to our office [phone] to be filled out and faxed pts insurance company. Pt verbalized understanding.

## 2016-03-07 NOTE — Telephone Encounter (Signed)
Physical exam form completed and faxed to [phone]. Copy sent to scanned into chart.

## 2016-04-04 ENCOUNTER — Ambulatory Visit (INDEPENDENT_AMBULATORY_CARE_PROVIDER_SITE_OTHER): Payer: Commercial Managed Care - PPO

## 2016-04-04 DIAGNOSIS — Z3042 Encounter for surveillance of injectable contraceptive: Secondary | ICD-10-CM | POA: Diagnosis not present

## 2016-04-04 DIAGNOSIS — Z309 Encounter for contraceptive management, unspecified: Secondary | ICD-10-CM

## 2016-04-04 MED ORDER — MEDROXYPROGESTERONE ACETATE 150 MG/ML IM SUSP: 150.0000 mg | INTRAMUSCULAR | 0 refills | Status: DC

## 2016-04-04 MED ORDER — MEDROXYPROGESTERONE ACETATE 150 MG/ML IM SUSP
150.0000 mg | Freq: Once | INTRAMUSCULAR | Status: AC
  Administered 2016-04-04: 150 mg via INTRAMUSCULAR

## 2016-06-26 ENCOUNTER — Telehealth: Payer: Self-pay | Admitting: *Deleted

## 2016-06-26 DIAGNOSIS — Z3042 Encounter for surveillance of injectable contraceptive: Secondary | ICD-10-CM

## 2016-06-26 MED ORDER — MEDROXYPROGESTERONE ACETATE 150 MG/ML IM SUSP: 150.0000 mg | INTRAMUSCULAR | 2 refills | Status: DC

## 2016-06-26 NOTE — Telephone Encounter (Signed)
Pt to pick up Depo Provera and bring to office.

## 2016-06-27 ENCOUNTER — Ambulatory Visit: Payer: Commercial Managed Care - PPO

## 2017-01-02 ENCOUNTER — Ambulatory Visit (INDEPENDENT_AMBULATORY_CARE_PROVIDER_SITE_OTHER): Payer: Commercial Managed Care - PPO | Admitting: Family Medicine

## 2017-01-02 ENCOUNTER — Encounter: Payer: Self-pay | Admitting: Family Medicine

## 2017-01-02 VITALS — BP 138/88 | HR 69 | Ht 67.0 in | Wt 195.0 lb

## 2017-01-02 DIAGNOSIS — E669 Obesity, unspecified: Secondary | ICD-10-CM | POA: Diagnosis not present

## 2017-01-02 DIAGNOSIS — Z0000 Encounter for general adult medical examination without abnormal findings: Secondary | ICD-10-CM | POA: Diagnosis not present

## 2017-01-02 NOTE — Progress Notes (Signed)
Subjective:     [name] is a 36 y.o. female and is here for a comprehensive physical exam. The patient reports problems - she is really struggling losing weight.  She is really frustrated she's been trying to lose weight since May. She's been going to the gym on her lunch break and she's been trying to eat much healthier. She says ever since she had her baby she just hasn't been able to get the weight back off. She's lost about 4 pounds and then gained it back and then lost about 2 or 3 pounds and then gained that back. She just feels very fresh. And a little overwhelmed.  Social History   Social History  . Marital status: Single    Spouse name: N/A  . Number of children: 3  . Years of education: N/A   Occupational History  . Pharmacy Tech.     Social History Main Topics  . Smoking status: Never Smoker  . Smokeless tobacco: Never Used  . Alcohol use 0.0 oz/week     Comment: socially  . Drug use: No  . Sexual activity: Yes    Partners: Male    Birth control/ protection: None     Comment: single, 3 kids, single mom, no regular exercise   Other Topics Concern  . Not on file   Social History Narrative   1-2 caffeinated drinks per day. No regular exercise.     Health Maintenance  Topic Date Due  . INFLUENZA VACCINE  01/02/2018 (Originally 11/15/2016)  . PAP SMEAR  02/14/2018  . TETANUS/TDAP  09/15/2025  . HIV Screening  Completed    The following portions of the patient's history were reviewed and updated as appropriate: allergies, current medications, past family history, past medical history, past social history, past surgical history and problem list.  Review of Systems A comprehensive review of systems was negative.   Objective:    BP 138/88   Pulse 69   Ht  (1.702 m)   Wt 195 lb (88.5 kg)   SpO2 100%   BMI 30.54 kg/m  General appearance: alert, cooperative and appears stated age Head: Normocephalic, without obvious abnormality, atraumatic Eyes: conj  clear, EOMI, PEERLA Ears: normal TM's and external ear canals both ears Nose: Nares normal. Septum midline. Mucosa normal. No drainage or sinus tenderness. Throat: lips, mucosa, and tongue normal; teeth and gums normal Neck: no adenopathy, no carotid bruit, no JVD, supple, symmetrical, trachea midline and thyroid not enlarged, symmetric, no tenderness/mass/nodules Back: symmetric, no curvature. ROM normal. No CVA tenderness. Lungs: clear to auscultation bilaterally Breasts: normal appearance, no masses or tenderness Heart: regular rate and rhythm, S1, S2 normal, no murmur, click, rub or gallop Abdomen: soft, non-tender; bowel sounds normal; no masses,  no organomegaly Extremities: extremities normal, atraumatic, no cyanosis or edema Pulses: 2+ and symmetric Skin: Skin color, texture, turgor normal. No rashes or lesions    Assessment:    Healthy female exam.      Plan:     See After Visit Summary for Counseling Recommendations   Keep up a regular exercise program and make sure you are eating a healthy diet Try to eat 4 servings of dairy a day, or if you are lactose intolerant take a calcium with vitamin D daily.  Your vaccines are up to date.  Abnormal weight gain/BMI 30-discussed some options. Will check her TSH just to make sure the thyroid is okay especially after giving birth. Also recommend that she actually start  calorie counting. Recommend using a smart phone application call my fitness pal or lose it. This will help her set some calorie goals. Try to continue with regular exercises daily at the gym during her lunch break. We also discussed working on increasing her sleep time. Most nights she only gets about 4 hours of sleep. This  can actually increase stress hormones and make it much more difficult for her to actually lose weight. Does ways to help reduce her racing thoughts when she's trying to go to sleep at night. If after tracking calories for the next 4-6 weeks she still  having difficulty losing weight then recommend that she follow back up with me to discuss further options such as working with a nutritionist etc.

## 2017-01-02 NOTE — Patient Instructions (Addendum)

## 2017-02-01 ENCOUNTER — Telehealth: Payer: Self-pay | Admitting: Family Medicine

## 2017-02-01 NOTE — Telephone Encounter (Signed)
Pt called clinic stating her BP is elevated and she has a "horrible" headache. Pt reports her headache started last night and still has it. Pt states when she got to work today she took her BP and it was 200/?, currently it is 180/120. Pt denies any chest pain, shortness of breath, dizziness, tachycardia. Does reports some blurred vision and lower extremity edema.   Spoke with two Providers in office who had openings this afternoon (Dr. Lyn HollingsheadAlexander and Vinetta Bergamoharley), both advised Pt should go to ED.  Advised Pt to go to ED. Suggested she get someone to drive her based on her symptom of blurred vision. Pt expressed understanding. She will contact clinic tomorrow to give PCP an update and schedule a hospital follow up.

## 2017-03-06 ENCOUNTER — Inpatient Hospital Stay (HOSPITAL_COMMUNITY)
Admission: AD | Admit: 2017-03-06 | Discharge: 2017-03-06 | Disposition: A | Discharge status: Home / Self Care | Payer: Commercial Managed Care - PPO | Source: Ambulatory Visit | Attending: Obstetrics and Gynecology | Admitting: Obstetrics and Gynecology

## 2017-03-06 ENCOUNTER — Encounter (HOSPITAL_COMMUNITY): Payer: Self-pay | Admitting: *Deleted

## 2017-03-06 ENCOUNTER — Inpatient Hospital Stay (HOSPITAL_COMMUNITY): Payer: Commercial Managed Care - PPO

## 2017-03-06 DIAGNOSIS — O00101 Right tubal pregnancy without intrauterine pregnancy: Secondary | ICD-10-CM | POA: Diagnosis not present

## 2017-03-06 DIAGNOSIS — O00201 Right ovarian pregnancy without intrauterine pregnancy: Secondary | ICD-10-CM | POA: Diagnosis not present

## 2017-03-06 DIAGNOSIS — N939 Abnormal uterine and vaginal bleeding, unspecified: Secondary | ICD-10-CM

## 2017-03-06 LAB — ABO/RH: ABO/RH(D): O POS

## 2017-03-06 LAB — URINALYSIS, ROUTINE W REFLEX MICROSCOPIC
Bilirubin Urine: NEGATIVE
Glucose, UA: NEGATIVE mg/dL
Ketones, ur: NEGATIVE mg/dL
Leukocytes, UA: NEGATIVE
Nitrite: NEGATIVE
PH: 5 (ref 5.0–8.0)
Protein, ur: 30 mg/dL — AB
SPECIFIC GRAVITY, URINE: 1.03 (ref 1.005–1.030)

## 2017-03-06 LAB — COMPREHENSIVE METABOLIC PANEL
ALBUMIN: 3.6 g/dL (ref 3.5–5.0)
ALT: 18 U/L (ref 14–54)
ANION GAP: 7 (ref 5–15)
AST: 19 U/L (ref 15–41)
Alkaline Phosphatase: 64 U/L (ref 38–126)
BUN: 10 mg/dL (ref 6–20)
CHLORIDE: 107 mmol/L (ref 101–111)
CO2: 23 mmol/L (ref 22–32)
CREATININE: 0.52 mg/dL (ref 0.44–1.00)
Calcium: 8.4 mg/dL — ABNORMAL LOW (ref 8.9–10.3)
Glucose, Bld: 95 mg/dL (ref 65–99)
POTASSIUM: 3.6 mmol/L (ref 3.5–5.1)
SODIUM: 137 mmol/L (ref 135–145)
Total Bilirubin: 0.6 mg/dL (ref 0.3–1.2)
Total Protein: 6.7 g/dL (ref 6.5–8.1)

## 2017-03-06 LAB — CBC
HCT: 31 % — ABNORMAL LOW (ref 36.0–46.0)
HEMOGLOBIN: 9.9 g/dL — AB (ref 12.0–15.0)
MCH: 25.4 pg — AB (ref 26.0–34.0)
MCHC: 31.9 g/dL (ref 30.0–36.0)
MCV: 79.7 fL (ref 78.0–100.0)
PLATELETS: 252 10*3/uL (ref 150–400)
RBC: 3.89 MIL/uL (ref 3.87–5.11)
RDW: 15.2 % (ref 11.5–15.5)
WBC: 8.5 10*3/uL (ref 4.0–10.5)

## 2017-03-06 LAB — WET PREP, GENITAL
CLUE CELLS WET PREP: NONE SEEN
SPERM: NONE SEEN
Trich, Wet Prep: NONE SEEN
YEAST WET PREP: NONE SEEN

## 2017-03-06 LAB — HCG, QUANTITATIVE, PREGNANCY: HCG, BETA CHAIN, QUANT, S: 5803 m[IU]/mL — AB (ref <5)

## 2017-03-06 MED ORDER — METHOTREXATE INJECTION FOR WOMEN'S HOSPITAL
50.0000 mg/m2 | Freq: Once | INTRAMUSCULAR | Status: AC
  Administered 2017-03-06: 105 mg via INTRAMUSCULAR
  Filled 2017-03-06: qty 2.1

## 2017-03-06 MED ORDER — ONDANSETRON HCL 4 MG PO TABS
8.0000 mg | ORAL_TABLET | Freq: Once | ORAL | Status: AC
  Administered 2017-03-06: 8 mg via ORAL
  Filled 2017-03-06: qty 2

## 2017-03-06 MED ORDER — ONDANSETRON HCL 4 MG PO TABS: 4.0000 mg | ORAL_TABLET | Freq: Three times a day (TID) | ORAL | 0 refills | Status: DC | PRN

## 2017-03-06 MED ORDER — ONDANSETRON HCL 4 MG/2ML IJ SOLN: 4.0000 mg | Freq: Once | INTRAMUSCULAR | Status: DC

## 2017-03-06 NOTE — MAU Note (Signed)
Patient [name] is a 36 y.o. [id number] At [redacted]w[redacted]d here with cramping and light bleeding that started this morning.  She was recently seen in the [place], [place] ED (appears to be part of [facility]) and told she was having a miscarriage. She was discharged home and recommended to follow-up with her ob-gyn.    History    CSN: [id number]  Arrival date and time: 03/06/17 1478   First Provider Initiated Contact with Patient 03/06/17 1031      Chief Complaint  Patient presents with  . Abdominal Pain  . Possible Pregnancy  . Vaginal Bleeding   36 y.o G5P4 presenting with chief complaint of vaginal bleeding with cramps. Per patient, she took two home pregnancy test last week, both were positive. LMP 01/24/17. She states this began Sunday night for which she went to [facility] ED. Per ED note, beta-HCG was 3600 and bedside abdominal ultrasound showed no gestational or yok sac. She was discharged with Nitrofuratonin prescription for UTI for which patient denies taking any of to this point. After discharge from ED, patient states pain and bleeding stopped completely all day yesterday and then began again this morning upon waking up around 6am. Pain is localized to RLQ with radiation to R lower back which she states "feels like the labor pains that I had with all my other kids". She describes pain as a constant throb, but notes pain to be absent during entirety of exam. Alleviating factors include hot water and ibuprofen (last dose x2 tablets at 0630). Aggravating factors include exertion of any kind. Patient reports x2 more quarter size clots passed this morning upon first urinating with notable bright red blood with wiping. After passing the clots, she states she used x1 pad until arrival at MAU today with only minimal amount of blood on it. Also complains of occasional nausea. Denies vomiting, BM change, or dysuria. Patient reports vaginal deliveries for all 4 children, without complications and  no bleeding associated with those pregnancies. Denies current birth control utilization, states she only received 2 depo shots and never went in for last appointment.    Abdominal Pain  Associated symptoms include nausea. Pertinent negatives include no constipation, diarrhea, dysuria, fever, hematochezia or vomiting.  Possible Pregnancy  Associated symptoms include abdominal pain and nausea. Pertinent negatives include no fever or vomiting.  Vaginal Bleeding  The patient's primary symptoms include pelvic pain. The patient's pertinent negatives include no vaginal discharge. Associated symptoms include abdominal pain and nausea. Pertinent negatives include no constipation, diarrhea, dysuria, fever or vomiting.    OB History    Gravida Para Term Preterm AB Living   5 4 4     4    SAB TAB Ectopic Multiple Live Births         0 4      Past Medical History:  Diagnosis Date  . Abnormal pap    cryo  . Anemia   . Chest pain   . H/O anxiety disorder   . Preeclampsia in postpartum period 11/27/2015  . Vaginal Pap smear, abnormal     Past Surgical History:  Procedure Laterality Date  . cervical cryo    . WISDOM TOOTH EXTRACTION      Family History  Problem Relation Age of Onset  . Hypertension Mother   . Heart failure Mother   . Diabetes Mother   . Hypertension Father   . Diabetes Maternal Aunt   . Hyperlipidemia Unknown        family  history    Social History   Tobacco Use  . Smoking status: Never Smoker  . Smokeless tobacco: Never Used  Substance Use Topics  . Alcohol use: Yes    Alcohol/week: 0.0 oz    Comment: socially  . Drug use: No    Allergies: No Known Allergies  Medications Prior to Admission  Medication Sig Dispense Refill Last Dose  . ibuprofen (ADVIL,MOTRIN) 200 MG tablet Take 600 mg by mouth every 6 (six) hours as needed for headache.   03/06/2017 at Unknown time  . Multiple Vitamin (MULTI-VITAMINS) TABS Take 1 tablet by mouth daily.   03/05/2017 at  Unknown time  . nitrofurantoin, macrocrystal-monohydrate, (MACROBID) 100 MG capsule Take 100 mg by mouth 2 (two) times daily.    at Has not started yet   . potassium chloride (K-DUR) 10 MEQ tablet Take 1 tablet by mouth 2 (two) times daily.    at Has not started yet     Review of Systems  Constitutional: Negative for fever.  Gastrointestinal: Positive for abdominal pain and nausea. Negative for constipation, diarrhea, hematochezia and vomiting.  Genitourinary: Positive for pelvic pain and vaginal bleeding. Negative for dysuria and vaginal discharge.  Hematological: Does not bruise/bleed easily.   Physical Exam   Blood pressure 120/61, pulse 70, resp. rate 16, height 5\' 7"  (1.702 m), weight 90.3 kg (199 lb), last menstrual period 01/24/2017, SpO2 100 %, not currently breastfeeding.  Physical Exam  Constitutional: She is oriented to person, place, and time. She appears well-developed and well-nourished. No distress.  HENT:  Head: Normocephalic.  Respiratory: No respiratory distress.  Genitourinary: Cervix exhibits no motion tenderness. Right adnexum displays no tenderness and no fullness. Left adnexum displays no tenderness and no fullness. There is bleeding in the vagina.  Genitourinary Comments: Chaperone present. Minimal, brown blood in vaginal vault.   Neurological: She is alert and oriented to person, place, and time.  Skin: Skin is warm and dry.    MAU Course  US OB Transvaginal Date/Time: 03/06/2017 11:39 AM Performed by: Marylene LandKooistra, Kathryn Lorraine, CNM Authorized by: Marylene LandKooistra, Kathryn Lorraine, CNM    G/C cultures pending.   Transvaginal U/S: No identifiable gestational sac. 2.5 x 2.2 x 1.8 cm cystic solid mass in R adnexa, hihgly suspicious for ectopic pregnancy. Dr. Vergie LivingPickens notified of critical result and is aware patient has not been told results so he will come down to talk with patient.   1350: Dr. Vergie LivingPickens discussed risk and benefits of surgery vs medicine treatment  for ectopic pregnancy. After discussion and due to desire to have children in the future, patient would like to proceed with the methotrexate.    Methotrexate Treatment Protocol for Ectopic Pregnancy  Pretreatment testing and instructions  hCG concentration  Transvaginal ultrasound  Blood group and Rh(D) typing; give Rhogam 300 mcg IM, if indicated  Complete blood count  Liver and renal function tests  Discontinue folic acid supplements  Counsel patient to avoid NSAIDs, recommend acetaminophen if an analgesic is needed  Advise patient to refrain from sexual intercourse and strenuous exercise  Treatment day  Single dose protocol   1  hCG.  Administer Methotrexate 50 mg/m2 body surface area IM  4  hCG  7  hCG  If <15 percent hCG decline from day 4 to 7, give additional dose of methotrexate 50 mg/m2 IM  If ?15 percent hCG decline from day 4 to 7, draw hCG weekly until undetectable  14  hCG  If <15 percent hCG decline from day  7 to 14, give additional dose of methotrexate 50 mg/m2 IM  If ?15 percent hCG decline from day 7 to 14, check hCG weekly until undetectable  21 and 28  If 3 doses have been given and there is a <15 percent hCG decline from day 21 to 28, proceed with laparoscopic surgery  Laparoscopy  If severe abdominal pain or an acute abdomen suggestive of tubal rupture occurs If ultrasonography reveals greater than 300 mL pelvic or other intraperitoneal fluid  The hCG concentration usually declines to less than 15 mIU/mL by 35 days postinjection but may take as long as 109 days. If the hCG does not decline to zero, a new pregnancy should be excluded; if the hCG is rising, a transvaginal ultrasound should be performed. Alternatively, some patients have a slow clearance of serum hCG. If three weekly values are similar, consider an additional dose of MTX (50 mg/m2) not to exceed the recommended maximum of three total doses. This typically accelerates the decline of serum hCG. The risk of  gestational trophoblastic disease is low. Folinic acid rescue is not required for women treated with the single-dose protocol, even if multiple doses are ultimately given.   Prepared with data from:   [facility]. Clinical practice. Ectopic pregnancy. Malva Limes Engl J Med 2009; 361:379  American College of Obstetricians and Gynecologists. ACOG Practice Bulletin No. 94: Medical management of ectopic pregnancy. Obstet Gynecol 2008; 161:[id number].    Assessment and Plan  1. Vaginal bleeding and cramping likely secondary to R tubal ectopic pregnancy identified on U/S: After discussion with Dr. Vergie LivingPickens, patient would like to proceed with injection of 105mg  IM methotrexate today (Day 1). Side effects, risks, and benefits discussed with the patient. Patient understands importance of following up  for serial beta-HCG trending. Appointment to be scheduled Friday (Day 4) to have levels checked. Patient also understands MTX has ~80% success rate, but surgical intervention may be ultimately required if MTX fails. Advised to discontinue taking her vitamin. Patient educated of signs of rupture that would require seeking immediate attention including but not limited to exquisite pain on R side, N/V, light headedness, syncope, significant increase in bleeding.   Felisa BonierBianca Seivley 03/06/2017, 1:58 PM   I confirm that I have verified the information documented in the physician assistant's note and that I have also personally reperformed the physical exam and all medical decision making activities.  Patient has a confirmed ectopic pregnancy with beta now at 5800, up from 3600. Patient desires methotrexate; patient's labs are stable for methotrexate injection, and patient has been counseled by Dr. Vergie LivingPickens about methotrexate vs. Surgery.    Patient is reliable for follow-up; patient will return on Friday, Nov 23 and Monday Nov 26 for beta. Reviewed strict ectopic precautions; instructed patient not to take ibuprofen and if her pain  becomes unmanageable with tylenol she should return to MAU immediately.   All questions answered;  Patient stable for discharge.   Luna KitchensKathryn Kooistra CNM

## 2017-03-06 NOTE — Discharge Instructions (Signed)
-STOP TAKING FOLIC ACID -DO NOT TAKE IBUPROFEN, TAKE TYLENOL -COME BACK ON Friday (Day after Thanksgiving) to MAU for hormone pregnancy check  and next Monday for hormone pregnancy check       Methotrexate injection What is this medicine? METHOTREXATE (METH oh TREX ate) is a chemotherapy drug used to treat cancer including breast cancer, leukemia, and lymphoma. This medicine can also be used to treat psoriasis and certain kinds of arthritis. This medicine may be used for other purposes; ask your health care provider or pharmacist if you have questions. What should I tell my health care provider before I take this medicine? They need to know if you have any of these conditions: -fluid in the stomach area or lungs -if you often drink alcohol -infection or immune system problems -kidney disease -liver disease -low blood counts, like low white cell, platelet, or red cell counts -lung disease -radiation therapy -stomach ulcers -ulcerative colitis -an unusual or allergic reaction to methotrexate, other medicines, foods, dyes, or preservatives -pregnant or trying to get pregnant -breast-feeding How should I use this medicine? This medicine is for infusion into a vein or for injection into muscle or into the spinal fluid (whichever applies). It is usually given by a health care professional in a hospital or clinic setting. In rare cases, you might get this medicine at home. You will be taught how to give this medicine. Use exactly as directed. Take your medicine at regular intervals. Do not take your medicine more often than directed. If this medicine is used for arthritis or psoriasis, it should be taken weekly, NOT daily. It is important that you put your used needles and syringes in a special sharps container. Do not put them in a trash can. If you do not have a sharps container, call your pharmacist or healthcare provider to get one. Talk to your pediatrician regarding the use of this  medicine in children. While this drug may be prescribed for children as young as 2 years for selected conditions, precautions do apply. Overdosage: If you think you have taken too much of this medicine contact a poison control center or emergency room at once. NOTE: This medicine is only for you. Do not share this medicine with others. What if I miss a dose? It is important not to miss your dose. Call your doctor or health care professional if you are unable to keep an appointment. If you give yourself the medicine and you miss a dose, talk with your doctor or health care professional. Do not take double or extra doses. What may interact with this medicine? This medicine may interact with the following medications: -acitretin -aspirin or aspirin-like medicines including salicylates -azathioprine -certain antibiotics like chloramphenicol, penicillin, tetracycline -certain medicines for stomach problems like esomeprazole, omeprazole, pantoprazole -cyclosporine -gold -hydroxychloroquine -live virus vaccines -mercaptopurine -NSAIDs, medicines for pain and inflammation, like ibuprofen or naproxen -other cytotoxic agents -penicillamine -phenylbutazone -phenytoin -probenacid -retinoids such as isotretinoin and tretinoin -steroid medicines like prednisone or cortisone -sulfonamides like sulfasalazine and trimethoprim/sulfamethoxazole -theophylline This list may not describe all possible interactions. Give your health care provider a list of all the medicines, herbs, non-prescription drugs, or dietary supplements you use. Also tell them if you smoke, drink alcohol, or use illegal drugs. Some items may interact with your medicine. What should I watch for while using this medicine? Avoid alcoholic drinks. In some cases, you may be given additional medicines to help with side effects. Follow all directions for their use. This medicine can make  you more sensitive to the sun. Keep out of the sun. If  you cannot avoid being in the sun, wear protective clothing and use sunscreen. Do not use sun lamps or tanning beds/booths. You may get drowsy or dizzy. Do not drive, use machinery, or do anything that needs mental alertness until you know how this medicine affects you. Do not stand or sit up quickly, especially if you are an older patient. This reduces the risk of dizzy or fainting spells. You may need blood work done while you are taking this medicine. Call your doctor or health care professional for advice if you get a fever, chills or sore throat, or other symptoms of a cold or flu. Do not treat yourself. This drug decreases your body's ability to fight infections. Try to avoid being around people who are sick. This medicine may increase your risk to bruise or bleed. Call your doctor or health care professional if you notice any unusual bleeding. Check with your doctor or health care professional if you get an attack of severe diarrhea, nausea and vomiting, or if you sweat a lot. The loss of too much body fluid can make it dangerous for you to take this medicine. Talk to your doctor about your risk of cancer. You may be more at risk for certain types of cancers if you take this medicine. Both men and women must use effective birth control with this medicine. Do not become pregnant while taking this medicine or until at least 1 normal menstrual cycle has occurred after stopping it. Women should inform their doctor if they wish to become pregnant or think they might be pregnant. Men should not father a child while taking this medicine and for 3 months after stopping it. There is a potential for serious side effects to an unborn child. Talk to your health care professional or pharmacist for more information. Do not breast-feed an infant while taking this medicine. What side effects may I notice from receiving this medicine? Side effects that you should report to your doctor or health care professional as  soon as possible: -allergic reactions like skin rash, itching or hives, swelling of the face, lips, or tongue -back pain -breathing problems or shortness of breath -confusion -diarrhea -dry, nonproductive cough -low blood counts - this medicine may decrease the number of white blood cells, red blood cells and platelets. You may be at increased risk of infections and bleeding -mouth sores -redness, blistering, peeling or loosening of the skin, including inside the mouth -seizures -severe headaches -signs of infection - fever or chills, cough, sore throat, pain or difficulty passing urine -signs and symptoms of bleeding such as bloody or black, tarry stools; red or dark-brown urine; spitting up blood or brown material that looks like coffee grounds; red spots on the skin; unusual bruising or bleeding from the eye, gums, or nose -signs and symptoms of kidney injury like trouble passing urine or change in the amount of urine -signs and symptoms of liver injury like dark yellow or brown urine; general ill feeling or flu-like symptoms; light-colored stools; loss of appetite; nausea; right upper belly pain; unusually weak or tired; yellowing of the eyes or skin -stiff neck -vomiting Side effects that usually do not require medical attention (report to your doctor or health care professional if they continue or are bothersome): -dizziness -hair loss -headache -stomach pain -upset stomach This list may not describe all possible side effects. Call your doctor for medical advice about side effects. You may  report side effects to FDA at 1-800-FDA-1088. Where should I keep my medicine? If you are using this medicine at home, you will be instructed on how to store this medicine. Throw away any unused medicine after the expiration date on the label. NOTE: This sheet is a summary. It may not cover all possible information. If you have questions about this medicine, talk to your doctor, pharmacist, or  health care provider.  2018 Elsevier/Gold Standard (2014-07-23 12:36:41)

## 2017-03-06 NOTE — MAU Note (Signed)
Pt reports 2 days ago she began having vaginal bleeding and cramping. Reports 2 positive home preg tests. [place] and had cultures and an ED ultrasound.

## 2017-03-07 LAB — GC/CHLAMYDIA PROBE AMP (~~LOC~~) NOT AT ARMC
CHLAMYDIA, DNA PROBE: NEGATIVE
NEISSERIA GONORRHEA: NEGATIVE

## 2017-03-09 ENCOUNTER — Encounter (HOSPITAL_COMMUNITY): Payer: Self-pay | Admitting: Advanced Practice Midwife

## 2017-03-09 ENCOUNTER — Inpatient Hospital Stay (HOSPITAL_COMMUNITY)
Admission: AD | Admit: 2017-03-09 | Discharge: 2017-03-09 | Disposition: A | Discharge status: Home / Self Care | Payer: Commercial Managed Care - PPO | Source: Ambulatory Visit | Attending: Obstetrics and Gynecology | Admitting: Obstetrics and Gynecology

## 2017-03-09 DIAGNOSIS — O00101 Right tubal pregnancy without intrauterine pregnancy: Secondary | ICD-10-CM | POA: Diagnosis not present

## 2017-03-09 DIAGNOSIS — O00109 Unspecified tubal pregnancy without intrauterine pregnancy: Secondary | ICD-10-CM | POA: Diagnosis not present

## 2017-03-09 DIAGNOSIS — Z79899 Other long term (current) drug therapy: Secondary | ICD-10-CM | POA: Diagnosis not present

## 2017-03-09 DIAGNOSIS — Z3A01 Less than 8 weeks gestation of pregnancy: Secondary | ICD-10-CM | POA: Insufficient documentation

## 2017-03-09 DIAGNOSIS — Z3201 Encounter for pregnancy test, result positive: Secondary | ICD-10-CM | POA: Diagnosis not present

## 2017-03-09 LAB — HCG, QUANTITATIVE, PREGNANCY: HCG, BETA CHAIN, QUANT, S: 13691 m[IU]/mL — AB (ref <5)

## 2017-03-09 NOTE — Discharge Instructions (Signed)
Methotrexate Treatment for an Ectopic Pregnancy °Methotrexate is a medicine that treats a pregnancy condition in which the fetus develops outside the uterus (ectopic pregnancy) by stopping the growth of the fertilized egg. It also helps your body absorb tissue from the egg. This takes between 2 weeks and 6 weeks. Most ectopic pregnancies can be successfully treated with methotrexate if they are detected early enough. °Tell a health care provider about: °· Any allergies you have. °· All medicines you are taking, including vitamins, herbs, eye drops, creams, and over-the-counter medicines. °· Any medical conditions you have. °What are the risks? °Generally, this is a safe treatment. However, problems can occur, including: °· Nausea. °· Vomiting. °· Diarrhea. °· Abdominal cramping. °· Mouth sores. °· Increased vaginal bleeding or spotting. °· Swelling or irritation of the lining of your lungs (pneumonitis). °· Failed treatment and continuation of the pregnancy. °· Liver damage. °· Hair loss. ° °There is still a risk of the ectopic pregnancy rupturing while using the methotrexate. °What happens before the procedure? °Before you take the medicine: °· Liver tests, kidney tests, and a complete blood test are performed. °· Blood tests are performed to measure the pregnancy hormone levels and to determine your blood type. °· If you are Rh-negative and the father is Rh-positive or his Rh type is not known, you will be given a Rho (D) immune globulin shot. ° °What happens during the procedure? °There are two methods that your health care provider may use to give you methotrexate. °· One method involves a single dose or injection of the medicine. °· Another method involves a series of doses given through several injections. ° °What happens after the procedure? °· You may have some abdominal cramping, vaginal bleeding, and fatigue in the first few days after taking methotrexate. °· Blood tests will be taken for several weeks to  check the pregnancy hormone levels. The blood tests are performed until there is no more pregnancy hormone detected in the blood. °This information is not intended to replace advice given to you by your health care provider. Make sure you discuss any questions you have with your health care provider. °Document Released: 03/28/2001 Document Revised: 09/09/2015 Document Reviewed: 01/20/2013 °Elsevier Interactive Patient Education © 2017 Elsevier Inc. ° °

## 2017-03-09 NOTE — MAU Note (Signed)
Patient here for follow up HCG, s/p Methotrexate. Mild cramping, denies vaginal bleeding.

## 2017-03-09 NOTE — MAU Provider Note (Signed)
History   First Provider Initiated Contact with Patient 03/09/17 1700     Chief Complaint:  Ectopic Pregnancy   [name] is  36 y.o. [id number] Patient's last menstrual period was 01/24/2017.Marland Kitchen. Patient is here for Day #4 quantitative HCG after MTX 03/06/17.   Since her last visit, the patient is without new complaint.     ROS Abdominal Pain: Mild cramping that resolved w/ Tylenol Vaginal bleeding: none.   Passage of clots or tissue: None Dizziness: None  O POS  Her previous Quantitative HCG values are:  Results for [name], [name] (MRN [id number]) as of 03/09/2017 18:09  Ref. Range 03/06/2017 11:17  HCG, Beta Chain, Quant, S Latest Ref Range: <5 mIU/mL 5,803 (H)   Physical Exam   Patient Vitals for the past 24 hrs:  BP Temp Pulse Resp  03/09/17 1546 136/71 98.4 F (36.9 C) 65 18   Constitutional: Well-nourished female in no apparent distress. No pallor Neuro: Alert and oriented 4 Cardiovascular: Normal rate Respiratory: Normal effort and rate Gynecological Exam: Not indicated  Labs: Results for orders placed or performed during the hospital encounter of 03/09/17 (from the past 24 hour(s))  hCG, quantitative, pregnancy   Collection Time: 03/09/17  3:48 PM  Result Value Ref Range   hCG, Beta Chain, Quant, S 13,691 (H) <5 mIU/mL    Ultrasound Studies:   NA  MAU course/MDM: Quantitative hCG ordered  Rise in Quant day #4 post-MTX. Hemodynamically stable. Consulted w/ Dr. Jolayne Watkins. Repeat MTX not indicated today. Will await day #7 results. Discussed w/ pt. Will have her return to MAU Monday 03/12/17 for quant.    Assessment: 1. Right tubal pregnancy without intrauterine pregnancy    Plan: Discharge home in stable condition. Ectopic precautions Follow-up Information    [facility] Follow up on 03/12/2017.   Why:  Bloodwork Contact information: [address] [id number] [place]  [phone] [phone]         Allergies as of 03/09/2017   No Known Allergies     Medication List    TAKE these medications   nitrofurantoin (macrocrystal-monohydrate) 100 MG capsule Commonly known as:  MACROBID Take 100 mg by mouth 2 (two) times daily.   ondansetron 4 MG tablet Commonly known as:  ZOFRAN Take 1 tablet (4 mg total) by mouth every 8 (eight) hours as needed for nausea or vomiting.   potassium chloride 10 MEQ tablet Commonly known as:  K-DUR Take 1 tablet by mouth 2 (two) times daily.       [name], [place], CNM 03/09/2017, 5:00 PM  2/3

## 2017-03-12 ENCOUNTER — Other Ambulatory Visit: Payer: Self-pay

## 2017-03-12 ENCOUNTER — Inpatient Hospital Stay (HOSPITAL_COMMUNITY)
Admission: AD | Admit: 2017-03-12 | Discharge: 2017-03-12 | Disposition: A | Discharge status: Home / Self Care | Payer: Commercial Managed Care - PPO | Source: Ambulatory Visit | Attending: Obstetrics & Gynecology | Admitting: Obstetrics & Gynecology

## 2017-03-12 DIAGNOSIS — N9489 Other specified conditions associated with female genital organs and menstrual cycle: Secondary | ICD-10-CM

## 2017-03-12 DIAGNOSIS — K661 Hemoperitoneum: Secondary | ICD-10-CM | POA: Diagnosis not present

## 2017-03-12 DIAGNOSIS — N898 Other specified noninflammatory disorders of vagina: Secondary | ICD-10-CM | POA: Diagnosis not present

## 2017-03-12 DIAGNOSIS — O00101 Right tubal pregnancy without intrauterine pregnancy: Secondary | ICD-10-CM | POA: Diagnosis not present

## 2017-03-12 DIAGNOSIS — R1909 Other intra-abdominal and pelvic swelling, mass and lump: Secondary | ICD-10-CM | POA: Diagnosis present

## 2017-03-12 DIAGNOSIS — Z09 Encounter for follow-up examination after completed treatment for conditions other than malignant neoplasm: Secondary | ICD-10-CM

## 2017-03-12 LAB — HCG, QUANTITATIVE, PREGNANCY: HCG, BETA CHAIN, QUANT, S: 11629 m[IU]/mL — AB (ref <5)

## 2017-03-12 NOTE — Discharge Instructions (Signed)
Ectopic Pregnancy °An ectopic pregnancy happens when a fertilized egg grows outside the uterus. A pregnancy cannot live outside of the uterus. This problem often happens in the fallopian tube. It is often caused by damage to the fallopian tube. °If this problem is found early, you may be treated with medicine. If your tube tears or bursts open (ruptures), you will bleed inside. This is an emergency. You will need surgery. Get help right away. °What are the signs or symptoms? °You may have normal pregnancy symptoms at first. These include: °· Missing your period. °· Feeling sick to your stomach (nauseous). °· Being tired. °· Having tender breasts. ° °Then, you may start to have symptoms that are not normal. These include: °· Pain with sex (intercourse). °· Bleeding from the vagina. This includes light bleeding (spotting). °· Belly (abdomen) or lower belly cramping or pain. This may be felt on one side. °· A fast heartbeat (pulse). °· Passing out (fainting) after going poop (bowel movement). ° °If your tube tears, you may have symptoms such as: °· Really bad pain in the belly or lower belly. This happens suddenly. °· Dizziness. °· Passing out. °· Shoulder pain. ° °Get help right away if: °You have any of these symptoms. This is an emergency. °This information is not intended to replace advice given to you by your health care provider. Make sure you discuss any questions you have with your health care provider. °Document Released: 06/30/2008 Document Revised: 09/09/2015 Document Reviewed: 11/13/2012 °Elsevier Interactive Patient Education © 2017 Elsevier Inc. ° °

## 2017-03-12 NOTE — MAU Note (Signed)
Doing pretty good. Denies any pain or bleeding.

## 2017-03-12 NOTE — MAU Note (Signed)
7day post MTX

## 2017-03-12 NOTE — MAU Provider Note (Signed)
S:  [name] is a 36 y.o. female [id number] here for Day # 7 quanitative hcg level after MTX on 03/06/17. She has a known right adnexal mass.  Currently she denies abdominal pain or vaginal bleeding.   O:  GENERAL: Well-developed, well-nourished female in no acute distress.  LUNGS: Effort normal SKIN: Warm, dry and without erythema PSYCH: Normal mood and affect  Vitals:   03/12/17 1558  BP: 121/72  Pulse: 67  Resp: 18  Temp: 98.9 F (37.2 C)  TempSrc: Oral  SpO2: 100%    Results for orders placed or performed during the hospital encounter of 03/12/17 (from the past 48 hour(s))  hCG, quantitative, pregnancy     Status: Abnormal   Collection Time: 03/12/17  3:28 PM  Result Value Ref Range   hCG, Beta Chain, Quant, S 11,629 (H) <5 mIU/mL    Comment:          GEST. AGE      CONC.  (mIU/mL)   <=1 WEEK        5 - 50     2 WEEKS       50 - 500     3 WEEKS       100 - 10,000     4 WEEKS     1,000 - 30,000     5 WEEKS     3,500 - 115,000   6-8 WEEKS     12,000 - 270,000    12 WEEKS     15,000 - 220,000        FEMALE AND NON-PREGNANT FEMALE:     LESS THAN 5 mIU/mL     MDM:  Day 1: 5803 Day 4: 13,691 Day 7: 11,629  15% decline in beta hcg level. Patient is asymptomatic and stable. She denies pain or vaginal bleeding Discussed patient with Dr. [place] NationAnyanwy. Ok for DC home with strict return precautions and close follow up in the [facility]   A:  1. Adnexal mass   2. Follow up     P:  Discharge home with strict ectopic precautions Pelvic rest Follow up with the [facility] in 1 week for beta hcg level. Message sent for scheduling Return to MAU if symptoms worsen  Tevion Laforge, Harolyn RutherfordJennifer I, NP 03/12/2017 8:45 PM

## 2017-03-13 ENCOUNTER — Inpatient Hospital Stay (HOSPITAL_COMMUNITY): Payer: Commercial Managed Care - PPO | Admitting: Anesthesiology

## 2017-03-13 ENCOUNTER — Ambulatory Visit (HOSPITAL_COMMUNITY)
Admission: AD | Admit: 2017-03-13 | Discharge: 2017-03-13 | Disposition: A | Discharge status: Home / Self Care | Payer: Commercial Managed Care - PPO | Source: Ambulatory Visit | Attending: Family Medicine | Admitting: Family Medicine

## 2017-03-13 ENCOUNTER — Encounter (HOSPITAL_COMMUNITY)
Admission: AD | Disposition: A | Discharge status: Home / Self Care | Payer: Self-pay | Source: Ambulatory Visit | Attending: Family Medicine

## 2017-03-13 ENCOUNTER — Inpatient Hospital Stay (HOSPITAL_COMMUNITY): Payer: Commercial Managed Care - PPO

## 2017-03-13 ENCOUNTER — Encounter (HOSPITAL_COMMUNITY): Payer: Self-pay | Admitting: *Deleted

## 2017-03-13 ENCOUNTER — Other Ambulatory Visit: Payer: Self-pay

## 2017-03-13 DIAGNOSIS — O00101 Right tubal pregnancy without intrauterine pregnancy: Secondary | ICD-10-CM | POA: Diagnosis not present

## 2017-03-13 DIAGNOSIS — K661 Hemoperitoneum: Secondary | ICD-10-CM | POA: Diagnosis not present

## 2017-03-13 DIAGNOSIS — I10 Essential (primary) hypertension: Secondary | ICD-10-CM | POA: Diagnosis not present

## 2017-03-13 DIAGNOSIS — O00201 Right ovarian pregnancy without intrauterine pregnancy: Secondary | ICD-10-CM

## 2017-03-13 DIAGNOSIS — O009 Unspecified ectopic pregnancy without intrauterine pregnancy: Secondary | ICD-10-CM

## 2017-03-13 HISTORY — PX: LAPAROSCOPY: SHX197

## 2017-03-13 HISTORY — PX: UNILATERAL SALPINGECTOMY: SHX6160

## 2017-03-13 LAB — CBC
HCT: 31 % — ABNORMAL LOW (ref 36.0–46.0)
HEMOGLOBIN: 9.9 g/dL — AB (ref 12.0–15.0)
MCH: 25.4 pg — ABNORMAL LOW (ref 26.0–34.0)
MCHC: 31.9 g/dL (ref 30.0–36.0)
MCV: 79.5 fL (ref 78.0–100.0)
PLATELETS: 264 10*3/uL (ref 150–400)
RBC: 3.9 MIL/uL (ref 3.87–5.11)
RDW: 15.1 % (ref 11.5–15.5)
WBC: 10.6 10*3/uL — ABNORMAL HIGH (ref 4.0–10.5)

## 2017-03-13 LAB — TYPE AND SCREEN
ABO/RH(D): O POS
ANTIBODY SCREEN: NEGATIVE

## 2017-03-13 LAB — HCG, QUANTITATIVE, PREGNANCY: HCG, BETA CHAIN, QUANT, S: 7205 m[IU]/mL — AB (ref <5)

## 2017-03-13 SURGERY — LAPAROSCOPY OPERATIVE
Anesthesia: General | Site: Abdomen | Laterality: Right

## 2017-03-13 MED ORDER — FENTANYL CITRATE (PF) 250 MCG/5ML IJ SOLN
INTRAMUSCULAR | Status: AC
  Filled 2017-03-13: qty 5

## 2017-03-13 MED ORDER — LIDOCAINE HCL (CARDIAC) 20 MG/ML IV SOLN
INTRAVENOUS | Status: AC
  Filled 2017-03-13: qty 5

## 2017-03-13 MED ORDER — PROPOFOL 10 MG/ML IV BOLUS
INTRAVENOUS | Status: DC | PRN
  Administered 2017-03-13: 200 mg via INTRAVENOUS

## 2017-03-13 MED ORDER — LACTATED RINGERS IV SOLN
INTRAVENOUS | Status: DC
  Administered 2017-03-13 (×2): via INTRAVENOUS

## 2017-03-13 MED ORDER — ONDANSETRON HCL 4 MG/2ML IJ SOLN
INTRAMUSCULAR | Status: DC | PRN
  Administered 2017-03-13: 4 mg via INTRAVENOUS

## 2017-03-13 MED ORDER — ONDANSETRON HCL 4 MG/2ML IJ SOLN
INTRAMUSCULAR | Status: AC
  Filled 2017-03-13: qty 2

## 2017-03-13 MED ORDER — ROCURONIUM BROMIDE 100 MG/10ML IV SOLN
INTRAVENOUS | Status: DC | PRN
  Administered 2017-03-13: 10 mg via INTRAVENOUS
  Administered 2017-03-13: 5 mg via INTRAVENOUS

## 2017-03-13 MED ORDER — SODIUM CHLORIDE 0.9 % IR SOLN
Status: DC | PRN
  Administered 2017-03-13: 3000 mL

## 2017-03-13 MED ORDER — MIDAZOLAM HCL 5 MG/5ML IJ SOLN
INTRAMUSCULAR | Status: DC | PRN
  Administered 2017-03-13: 2 mg via INTRAVENOUS

## 2017-03-13 MED ORDER — MEPERIDINE HCL 25 MG/ML IJ SOLN
INTRAMUSCULAR | Status: AC
  Filled 2017-03-13: qty 1

## 2017-03-13 MED ORDER — ROCURONIUM BROMIDE 100 MG/10ML IV SOLN
INTRAVENOUS | Status: AC
  Filled 2017-03-13: qty 1

## 2017-03-13 MED ORDER — PROMETHAZINE HCL 25 MG/ML IJ SOLN: 6.2500 mg | INTRAMUSCULAR | Status: DC | PRN

## 2017-03-13 MED ORDER — SUGAMMADEX SODIUM 500 MG/5ML IV SOLN
INTRAVENOUS | Status: AC
  Filled 2017-03-13: qty 5

## 2017-03-13 MED ORDER — NEOSTIGMINE METHYLSULFATE 5 MG/5ML IV SOSY
PREFILLED_SYRINGE | INTRAVENOUS | Status: DC | PRN
  Administered 2017-03-13: .1 mg via INTRAVENOUS

## 2017-03-13 MED ORDER — HYDROMORPHONE HCL 1 MG/ML IJ SOLN: 0.2500 mg | INTRAMUSCULAR | Status: DC | PRN

## 2017-03-13 MED ORDER — IBUPROFEN 600 MG PO TABS: 600.0000 mg | ORAL_TABLET | Freq: Four times a day (QID) | ORAL | 0 refills | Status: DC | PRN

## 2017-03-13 MED ORDER — OXYCODONE-ACETAMINOPHEN 5-325 MG PO TABS: 1.0000 | ORAL_TABLET | Freq: Four times a day (QID) | ORAL | 0 refills | Status: DC | PRN

## 2017-03-13 MED ORDER — LIDOCAINE HCL (CARDIAC) 20 MG/ML IV SOLN
INTRAVENOUS | Status: DC | PRN
  Administered 2017-03-13: 30 mg via INTRAVENOUS

## 2017-03-13 MED ORDER — FENTANYL CITRATE (PF) 100 MCG/2ML IJ SOLN
INTRAMUSCULAR | Status: DC | PRN
  Administered 2017-03-13: 100 ug via INTRAVENOUS
  Administered 2017-03-13: 50 ug via INTRAVENOUS
  Administered 2017-03-13: 100 ug via INTRAVENOUS

## 2017-03-13 MED ORDER — SUCCINYLCHOLINE CHLORIDE 200 MG/10ML IV SOSY
PREFILLED_SYRINGE | INTRAVENOUS | Status: AC
  Filled 2017-03-13: qty 10

## 2017-03-13 MED ORDER — DEXAMETHASONE SODIUM PHOSPHATE 10 MG/ML IJ SOLN
INTRAMUSCULAR | Status: AC
  Filled 2017-03-13: qty 1

## 2017-03-13 MED ORDER — SUCCINYLCHOLINE CHLORIDE 200 MG/10ML IV SOSY
PREFILLED_SYRINGE | INTRAVENOUS | Status: DC | PRN
  Administered 2017-03-13: 120 mg via INTRAVENOUS

## 2017-03-13 MED ORDER — MIDAZOLAM HCL 2 MG/2ML IJ SOLN
INTRAMUSCULAR | Status: AC
  Filled 2017-03-13: qty 2

## 2017-03-13 MED ORDER — PROPOFOL 10 MG/ML IV BOLUS
INTRAVENOUS | Status: AC
  Filled 2017-03-13: qty 20

## 2017-03-13 MED ORDER — SUGAMMADEX SODIUM 200 MG/2ML IV SOLN
INTRAVENOUS | Status: DC | PRN
  Administered 2017-03-13: 200 mg via INTRAVENOUS

## 2017-03-13 MED ORDER — HYDROMORPHONE HCL 1 MG/ML IJ SOLN
0.5000 mg | Freq: Once | INTRAMUSCULAR | Status: AC
  Administered 2017-03-13: 0.5 mg via INTRAMUSCULAR
  Filled 2017-03-13: qty 1

## 2017-03-13 MED ORDER — LACTATED RINGERS IV SOLN: INTRAVENOUS | Status: DC

## 2017-03-13 MED ORDER — BUPIVACAINE HCL (PF) 0.25 % IJ SOLN
INTRAMUSCULAR | Status: DC | PRN
  Administered 2017-03-13: 10 mL

## 2017-03-13 MED ORDER — DEXAMETHASONE SODIUM PHOSPHATE 4 MG/ML IJ SOLN
INTRAMUSCULAR | Status: DC | PRN
  Administered 2017-03-13: 10 mg via INTRAVENOUS

## 2017-03-13 MED ORDER — MEPERIDINE HCL 25 MG/ML IJ SOLN
6.2500 mg | INTRAMUSCULAR | Status: DC | PRN
  Administered 2017-03-13 (×2): 12.5 mg via INTRAVENOUS

## 2017-03-13 SURGICAL SUPPLY — 30 items
CABLE HIGH FREQUENCY MONO STRZ (ELECTRODE) IMPLANT
DERMABOND ADVANCED (GAUZE/BANDAGES/DRESSINGS) ×1
DERMABOND ADVANCED .7 DNX12 (GAUZE/BANDAGES/DRESSINGS) ×2 IMPLANT
DRSG OPSITE POSTOP 3X4 (GAUZE/BANDAGES/DRESSINGS) ×3 IMPLANT
DURAPREP 26ML APPLICATOR (WOUND CARE) ×3 IMPLANT
GLOVE BIO SURGEON STRL SZ 6.5 (GLOVE) ×3 IMPLANT
GLOVE BIOGEL PI IND STRL 7.0 (GLOVE) ×8 IMPLANT
GLOVE BIOGEL PI INDICATOR 7.0 (GLOVE) ×4
GOWN STRL REUS W/TWL LRG LVL3 (GOWN DISPOSABLE) ×6 IMPLANT
NEEDLE INSUFFLATION 120MM (ENDOMECHANICALS) ×3 IMPLANT
NS IRRIG 1000ML POUR BTL (IV SOLUTION) ×3 IMPLANT
PACK LAPAROSCOPY BASIN (CUSTOM PROCEDURE TRAY) ×3 IMPLANT
PACK TRENDGUARD 450 HYBRID PRO (MISCELLANEOUS) ×2 IMPLANT
PACK TRENDGUARD 600 HYBRD PROC (MISCELLANEOUS) IMPLANT
POUCH LAPAROSCOPIC INSTRUMENT (MISCELLANEOUS) ×3 IMPLANT
POUCH SPECIMEN RETRIEVAL 10MM (ENDOMECHANICALS) ×3 IMPLANT
PROTECTOR NERVE ULNAR (MISCELLANEOUS) ×6 IMPLANT
SET IRRIG TUBING LAPAROSCOPIC (IRRIGATION / IRRIGATOR) ×3 IMPLANT
SHEARS HARMONIC ACE PLUS 36CM (ENDOMECHANICALS) ×3 IMPLANT
SLEEVE XCEL OPT CAN 5 100 (ENDOMECHANICALS) ×3 IMPLANT
STRIP CLOSURE SKIN 1/2X4 (GAUZE/BANDAGES/DRESSINGS) IMPLANT
SUT VICRYL 0 UR6 27IN ABS (SUTURE) ×3 IMPLANT
SUT VICRYL 4-0 PS2 18IN ABS (SUTURE) ×3 IMPLANT
TOWEL OR 17X24 6PK STRL BLUE (TOWEL DISPOSABLE) ×6 IMPLANT
TRAY FOLEY CATH SILVER 14FR (SET/KITS/TRAYS/PACK) ×3 IMPLANT
TRENDGUARD 450 HYBRID PRO PACK (MISCELLANEOUS) ×3
TRENDGUARD 600 HYBRID PROC PK (MISCELLANEOUS)
TROCAR XCEL DIL TIP R 11M (ENDOMECHANICALS) ×3 IMPLANT
TROCAR XCEL NON-BLD 5MMX100MML (ENDOMECHANICALS) ×3 IMPLANT
WARMER LAPAROSCOPE (MISCELLANEOUS) ×3 IMPLANT

## 2017-03-13 NOTE — Anesthesia Preprocedure Evaluation (Addendum)
Anesthesia Evaluation  Patient identified by MRN, date of birth, ID band Patient awake    Reviewed: Allergy & Precautions, NPO status , Patient's Chart, lab work & pertinent test results  Airway Mallampati: I  TM Distance: >3 FB Neck ROM: Full    Dental  (+) Teeth Intact, Dental Advisory Given   Pulmonary neg pulmonary ROS,    breath sounds clear to auscultation       Cardiovascular hypertension, negative cardio ROS   Rhythm:Regular Rate:Normal     Neuro/Psych PSYCHIATRIC DISORDERS Anxiety negative neurological ROS     GI/Hepatic negative GI ROS, Neg liver ROS,   Endo/Other  negative endocrine ROS  Renal/GU negative Renal ROS     Musculoskeletal negative musculoskeletal ROS (+)   Abdominal (+) + obese,   Peds  Hematology  (+) anemia ,   Anesthesia Other Findings Day of surgery medications reviewed with the patient.  Reproductive/Obstetrics                            Lab Results  Component Value Date   WBC 10.6 (H) 03/13/2017   HGB 9.9 (L) 03/13/2017   HCT 31.0 (L) 03/13/2017   MCV 79.5 03/13/2017   PLT 264 03/13/2017   Lab Results  Component Value Date   CREATININE 0.52 03/06/2017   BUN 10 03/06/2017   NA 137 03/06/2017   K 3.6 03/06/2017   CL 107 03/06/2017   CO2 23 03/06/2017   No results found for: INR, PROTIME    Anesthesia Physical Anesthesia Plan  ASA: II and emergent  Anesthesia Plan: General   Post-op Pain Management:    Induction: Intravenous, Rapid sequence and Cricoid pressure planned  PONV Risk Score and Plan: 4 or greater and Ondansetron, Dexamethasone, Midazolam and Scopolamine patch - Pre-op  Airway Management Planned: Oral ETT  Additional Equipment:   Intra-op Plan:   Post-operative Plan: Extubation in OR  Informed Consent: I have reviewed the patients History and Physical, chart, labs and discussed the procedure including the risks, benefits  and alternatives for the proposed anesthesia with the patient or authorized representative who has indicated his/her understanding and acceptance.   Dental advisory given  Plan Discussed with: CRNA  Anesthesia Plan Comments:         Anesthesia Quick Evaluation

## 2017-03-13 NOTE — Anesthesia Procedure Notes (Signed)
Procedure Name: Intubation Date/Time: 03/13/2017 8:43 PM Performed by: Alvy Bimler, CRNA Pre-anesthesia Checklist: Patient identified, Patient being monitored, Timeout performed, Emergency Drugs available and Suction available Patient Re-evaluated:Patient Re-evaluated prior to induction Oxygen Delivery Method: Circle System Utilized Preoxygenation: Pre-oxygenation with 100% oxygen Induction Type: IV induction and Cricoid Pressure applied Ventilation: Mask ventilation without difficulty Laryngoscope Size: Mac and 3 Grade View: Grade II Tube type: Oral Tube size: 7.0 mm Number of attempts: 1 Airway Equipment and Method: stylet Placement Confirmation: ETT inserted through vocal cords under direct vision,  positive ETCO2 and breath sounds checked- equal and bilateral Secured at: 21 cm Tube secured with: Tape Dental Injury: Teeth and Oropharynx as per pre-operative assessment

## 2017-03-13 NOTE — H&P (Signed)
[name] is an 36 y.o. female [phone] at 5842w6d with a known right-sided ectopic pregnancy here with abdominal pain that started at 4:30 pm.  Ectopic pregnancy was diagnosed on 11-20 and treated with methotrexate. Patient has been compliant with methotrexate treatment and completed her Day 7 labs on 03-12-2017.     Pertinent Gynecological History:   Past Medical History:  Diagnosis Date  . Abnormal pap    cryo  . Anemia   . Chest pain   . H/O anxiety disorder   . Preeclampsia in postpartum period 11/27/2015  . Vaginal Pap smear, abnormal     Past Surgical History:  Procedure Laterality Date  . cervical cryo    . WISDOM TOOTH EXTRACTION      Family History  Problem Relation Age of Onset  . Hypertension Mother   . Heart failure Mother   . Diabetes Mother   . Hypertension Father   . Diabetes Maternal Aunt   . Hyperlipidemia Unknown        family history    Social History:  reports that  has never smoked. she has never used smokeless tobacco. She reports that she drinks alcohol. She reports that she does not use drugs.  Allergies: No Known Allergies  Medications Prior to Admission  Medication Sig Dispense Refill Last Dose  . nitrofurantoin, macrocrystal-monohydrate, (MACROBID) 100 MG capsule Take 100 mg by mouth 2 (two) times daily.    at Has not started yet   . potassium chloride (K-DUR) 10 MEQ tablet Take 1 tablet by mouth 2 (two) times daily.    at Has not started yet     Review of Systems  Constitutional: Negative.   HENT: Negative.   Respiratory: Negative.   Cardiovascular: Negative.   Gastrointestinal: Positive for abdominal pain.       5/10; aching and bloating feeling that started this morning. She felt tired and weak but the pain really started at 4:30 pm. Has tried tylenol but it has not helped. Has not noticed any bleeding or unusual discharge.   Genitourinary: Negative.   Musculoskeletal: Negative.   Skin: Negative.   Neurological: Negative.      Blood pressure 129/74, pulse 69, temperature 98.8 F (37.1 C), temperature source Oral, resp. rate 17, last menstrual period 01/24/2017, SpO2 100 %, not currently breastfeeding. Physical Exam  Constitutional: She is oriented to person, place, and time. She appears well-developed.  HENT:  Head: Normocephalic.  Neck: Normal range of motion.  Respiratory: Effort normal.  GI: Soft. There is tenderness. There is no rebound and no guarding.  Genitourinary: Vagina normal. No vaginal discharge found.  Musculoskeletal: Normal range of motion.  Neurological: She is alert and oriented to person, place, and time.  Skin: Skin is warm and dry.  Psychiatric: She has a normal mood and affect.    Results for orders placed or performed during the hospital encounter of 03/13/17 (from the past 24 hour(s))  CBC     Status: Abnormal   Collection Time: 03/13/17  6:08 PM  Result Value Ref Range   WBC 10.6 (H) 4.0 - 10.5 K/uL   RBC 3.90 3.87 - 5.11 MIL/uL   Hemoglobin 9.9 (L) 12.0 - 15.0 g/dL   HCT 98.131.0 (L) 19.136.0 - 47.846.0 %   MCV 79.5 78.0 - 100.0 fL   MCH 25.4 (L) 26.0 - 34.0 pg   MCHC 31.9 30.0 - 36.0 g/dL   RDW 29.515.1 62.111.5 - 30.815.5 %   Platelets 264 150 - 400 K/uL  hCG, quantitative, pregnancy     Status: Abnormal   Collection Time: 03/13/17  6:08 PM  Result Value Ref Range   hCG, Beta Chain, Quant, S 7,205 (H) <5 mIU/mL    [place] Ob Transvaginal  Result Date: 03/13/2017 CLINICAL DATA:  History of ectopic, now with increased pain EXAM: TRANSVAGINAL OB ULTRASOUND TECHNIQUE: Transvaginal ultrasound was performed for complete evaluation of the gestation as well as the maternal uterus, adnexal regions, and pelvic cul-de-sac. COMPARISON:  03/06/2017 FINDINGS: Intrauterine gestational sac: Not visible Yolk sac:  Not visible Embryo:  Not visible Maternal uterus/adnexae: Increased echogenic mass in the right adnexa, separate from the right ovary now measuring 6 x 5.4 x 4 cm. Development of large amount of complex  pelvic fluid. Left ovary measures 2.7 x 1.8 x 1.6 cm. Right ovary measures 3 x 4.6 x 3.3 cm. IMPRESSION: 1. Interval increase in size of a complex echogenic mass in the right adnexa now measuring 6 cm, consistent with ectopic pregnancy with hematoma. New large amount of complex/hemorrhagic fluid in the pelvis, would be consistent with ruptured ectopic. Critical Value/emergent results were called by telephone at the time of interpretation on 03/13/2017 at 7:09 pm to Dr. Luna KitchensKATHRYN Kooper Godshall , who verbally acknowledged these results. Electronically Signed   By: Jasmine PangKim  Fujinaga M.D.   On: 03/13/2017 19:09    Assessment/Plan: -Consult with Dr. Adrian BlackwaterStinson, who is bedside to speak with the patient.  -Will admit for laproscopic surgery.   Charlesetta GaribaldiKathryn Lorraine Arli Bree CNM 03/13/2017, 7:20 PM

## 2017-03-13 NOTE — MAU Note (Signed)
Started having pain in RLQ earlier today, when went to pick up spouse tonight, it became severe.  Denies any pain.  (8days post MTX)

## 2017-03-13 NOTE — Transfer of Care (Signed)
Immediate Anesthesia Transfer of Care Note  Patient: [name]  Procedure(s) Performed: LAPAROSCOPY OPERATIVE (N/A Abdomen) UNILATERAL SALPINGECTOMY (Right Abdomen)  Patient Location: PACU  Anesthesia Type:General  Level of Consciousness: awake, alert , oriented and patient cooperative  Airway & Oxygen Therapy: Patient Spontanous Breathing and Patient connected to nasal cannula oxygen  Post-op Assessment: Report given to RN, Post -op Vital signs reviewed and stable and Patient moving all extremities X 4  Post vital signs: Reviewed and stable  Last Vitals:  Vitals:   03/13/17 1753  BP: 129/74  Pulse: 69  Resp: 17  Temp: 37.1 C  SpO2: 100%    Last Pain:  Vitals:   03/13/17 1842  TempSrc:   PainSc: 8          Complications: No apparent anesthesia complications

## 2017-03-13 NOTE — Op Note (Signed)
[name] PROCEDURE DATE: 03/13/2017  PREOPERATIVE DIAGNOSIS: Ruptured ectopic pregnancy POSTOPERATIVE DIAGNOSIS: Ruptured right fallopian tube ectopic pregnancy PROCEDURE: Laparoscopic right salpingectomy and removal of ectopic pregnancy SURGEON:  Adam PhenixArnold, Linsy Ehresman G, MD ANESTHESIOLOGIST: Shelton SilvasHollis, Kevin D, MD Anesthesiologist: Shelton SilvasHollis, Kevin D, MD CRNA: Orlie PollenMerritt, Debra R, CRNA  INDICATIONS: 36 y.o. [id number] at 6885w6d here with the preoperative diagnoses as listed above.  Please refer to preoperative notes for more details. Patient was counseled regarding need for laparoscopic salpingectomy. Risks of surgery including bleeding which may require transfusion or reoperation, infection, injury to bowel or other surrounding organs, need for additional procedures including laparotomy and other postoperative/anesthesia complications were explained to patient.  Written informed consent was obtained.  FINDINGS:  Moderate amount of hemoperitoneum estimated to be about 200 ml of blood and clots.  Dilated right fallopian tube containing ectopic gestation. Small normal appearing uterus, normal left fallopian tube, right ovary and left ovary.  ANESTHESIA: General INTRAVENOUS FLUIDS: 1000 ml ESTIMATED BLOOD LOSS: 200 ml URINE OUTPUT: 100 ml SPECIMENS: Right fallopian tube containing ectopic gestation COMPLICATIONS: None immediate  PROCEDURE IN DETAIL:  The patient was taken to the operating room where general anesthesia was administered and was found to be adequate.  She was placed in the dorsal lithotomy position, and was prepped and draped in a sterile manner.  A Foley catheter was inserted into her bladder and attached to constant drainage and a uterine manipulator was then advanced into the uterus .    After an adequate timeout was performed, attention was turned to the abdomen where an umbilical incision was made with the scalpel.  The 11-mm trocar and sleeve were then advanced without difficulty with the  laparoscope direct visualization into the abdomen.  The abdomen was then insufflated with carbon dioxide gas and adequate pneumoperitoneum was obtained.  A survey of the patient's pelvis and abdomen revealed the findings above.  Two 5-mm left lower quadrant ports were then placed under direct visualization.  The Nezhat suction irrigator was then used to suction the hemoperitoneum and irrigate the pelvis.  Attention was then turned to the right fallopian tube which was grasped and ligated from the underlying mesosalpinx and uterine attachment using the Harmonic instrument.  Good hemostasis was noted.  The specimen was placed in an EndoCatch bag and removed from the abdomen intact.  The abdomen was desufflated, and all instruments were removed.  The fascial incision of the 10-mm site was reapproximated with a 0 Vicryl figure-of-eight stitch; and all skin incisions were closed with 4-0 Vicryl and Dermabond. The patient tolerated the procedure well.  All instruments, needles, and sponge counts were correct x 2. The patient was taken to the recovery room in stable condition.   The patient will be discharged to home as per PACU criteria.  Routine postoperative instructions given.  She was prescribed Percocet, Ibuprofen.  She will follow up in the clinic in about 2-3 weeks for postoperative evaluation.   Adam PhenixArnold, Lennart Gladish G, MD 03/13/2017 9:33 PM

## 2017-03-13 NOTE — Discharge Instructions (Signed)
Ruptured Ectopic Pregnancy °An ectopic pregnancy is when the fertilized egg attaches (implants) outside the uterus. Most ectopic pregnancies occur in the fallopian tube. Rarely do ectopic pregnancies occur on the ovary, intestine, pelvis, or cervix. An ectopic pregnancy does not have the ability to develop into a normal, healthy baby. °A ruptured ectopic pregnancy is one in which the fallopian tube gets torn or bursts and results in internal bleeding. Often there is intense abdominal pain, and sometimes, vaginal bleeding. Having an ectopic pregnancy can be a life-threatening experience. If left untreated, this dangerous condition can lead to a blood transfusion, abdominal surgery, or even death. °What are the causes? °Damage to the fallopian tubes is the suspected cause in most ectopic pregnancies. °What increases the risk? °Depending on your circumstances, the amount of risk of having an ectopic pregnancy will vary. There are 3 categories that may help you identify whether you are potentially at risk. °High Risk °· You have gone through infertility treatment. °· You have had a previous ectopic pregnancy. °· You have had previous tubal surgery. °· You have had previous surgery to have the fallopian tubes tied (tubal ligation). °· You have tubal problems or diseases. °· You have been exposed to DES. DES is a medicine that was used until 1971 and had effects on babies whose mothers took the medicine. °· You become pregnant while using an intrauterine device (IUD) for birth control. ° °Moderate Risk °· You have a history of infertility. °· You have a history of a sexually transmitted infection (STI). °· You have a history of pelvic inflammatory disease (PID). °· You have scarring from endometriosis. °· You have multiple sexual partners. °· You smoke. ° °Low Risk °· You have had previous pelvic surgery. °· You use vaginal douching. °· You became sexually active before 35 years of age. ° °What are the signs or  symptoms? °An ectopic pregnancy should be suspected in anyone who has missed a period and has abdominal pain or bleeding. °· You may experience normal pregnancy symptoms, such as: °? Nausea. °? Tiredness. °? Breast tenderness. °· Symptoms that are not normal include: °? Pain with intercourse. °? Irregular vaginal bleeding or spotting. °? Cramping or pain on one side, or in the lower abdomen. °? Fast heartbeat. °? Passing out while having a bowel movement. °· Symptoms of a ruptured ectopic pregnancy and internal bleeding may include: °? Sudden, severe pain in the abdomen and pelvis. °? Dizziness or fainting. °? Pain in the shoulder area. ° °How is this diagnosed? °Tests that may be performed include: °· A pregnancy test. °· An ultrasound. °· Testing the specific level of pregnancy hormone in the bloodstream. °· Taking a sample of uterus tissue (dilation and curettage, D&C). °· Surgery to perform a visual exam of the inside of the abdomen using a lighted tube (laparoscopy). ° °How is this treated? °Laparoscopic surgery or abdominal surgery is recommended for a ruptured ectopic pregnancy. °· The whole fallopian tube may need to be removed (salpingectomy). °· If the tube is not too damaged, the tube may be saved, and the pregnancy will be surgically removed. In time, the tube may still function. °· If you have lost a lot of blood, you may need a blood transfusion. °· You may receive a Rho (D) immune globulin shot if you are Rh negative and the father is Rh positive, or if you do not know the Rh type of the father. This is to prevent problems with any future pregnancy. ° °Get help right   away if: You have any symptoms of an ectopic or ruptured ectopic pregnancy. This is a medical emergency. This information is not intended to replace advice given to you by your health care provider. Make sure you discuss any questions you have with your health care provider. Document Released: 03/31/2000 Document Revised: 10/22/2015  Document Reviewed: 01/13/2013 Elsevier Interactive Patient Education  2018 [employer]  Salpingectomy, Care After This sheet gives you information about how to care for yourself after your procedure. Your health care provider may also give you more specific instructions. If you have problems or questions, contact your health care provider. What can I expect after the procedure? After your procedure, it is common to have:  Pain in your abdomen.  Some occasional vaginal bleeding (spotting).  Tiredness.  Follow these instructions at home: Incision care   Keep your incision area and your bandage (dressing) clean and dry.  Follow instructions from your health care provider about how to take care of your incision. Make sure you: ? Wash your hands with soap and water before you change your dressing. If soap and water are not available, use hand sanitizer. ? Change your dressing astold by your health care provider. ? Leave stitches (sutures), staples, skin glue, or adhesive strips in place. These skin closures may need to stay in place for 2 weeks or longer. If adhesive strip edges start to loosen and curl up, you may trim the loose edges. Do not remove adhesive strips completely unless your health care provider tells you to do that.  Check your incision area every day for signs of infection. Check for: ? More redness, swelling, or pain. ? More fluid or blood. ? Warmth. ? Pus or a bad smell. Activity   Do not drive or use heavy machinery while taking prescription pain medicine.  Do not drive for 24 hours if you received a medicine to help you relax (sedative).  Rest as directed by your health care provider. Ask your health care provider what activities are safe for you. You should avoid: ? Lifting anything that is heavier than 10 lb (4.5 kg) until your health care provider approves. ? Activities that require a lot of energy.  Until your health care provider approves: ? Do not  douche. ? Do not use tampons. ? Do not have sexual intercourse. General instructions  Take over-the-counter and prescription medicines only as told by your health care provider.  To prevent or treat constipation while you are taking prescription pain medicine, your health care provider may recommend that you: ? Drink enough fluid to keep your urine clear or pale yellow. ? Take over-the-counter or prescription medicines. ? Eat foods that are high in fiber, such as fresh fruits and vegetables, whole grains, and beans. ? Limit foods that are high in fat and processed sugars, such as fried and sweet foods.  Do not take baths, swim, or use a hot tub until your health care provider approves. You may take showers.  Wear compression stockings as told by your health care provider. These stockings help to prevent blood clots and reduce swelling in your legs.  Keep all follow-up visits as told by your health care provider. This is important. Contact a health care provider if:  You have: ? Pain when you urinate. ? More redness, swelling, or pain around your incision. ? More fluid or blood coming from your incision. ? Pus or a bad smell coming from your incision. ? A fever. ? Abdominal pain that gets worse  or does not get better with medicine.  Your incision feels warm to the touch.  Your incision starts to break open.  You develop a rash.  You develop nausea and vomiting.  You feel light-headed. Get help right away if:  You develop pain in your chest or leg.  You develop shortness of breath.  You faint.  You have increased vaginal bleeding. This information is not intended to replace advice given to you by your health care provider. Make sure you discuss any questions you have with your health care provider. Document Released: 07/08/2010 Document Revised: 12/01/2015 Document Reviewed: 12/02/2015 Elsevier Interactive Patient Education  [employer]2018 [employer] Inc[employer]

## 2017-03-13 NOTE — Anesthesia Postprocedure Evaluation (Signed)
Anesthesia Post Note  Patient: [name]  Procedure(s) Performed: LAPAROSCOPY OPERATIVE (N/A Abdomen) UNILATERAL SALPINGECTOMY (Right Abdomen)     Patient location during evaluation: PACU Anesthesia Type: General Level of consciousness: awake and alert Pain management: pain level controlled Vital Signs Assessment: post-procedure vital signs reviewed and stable Respiratory status: spontaneous breathing, nonlabored ventilation, respiratory function stable and patient connected to nasal cannula oxygen Cardiovascular status: blood pressure returned to baseline and stable Postop Assessment: no apparent nausea or vomiting Anesthetic complications: no    Last Vitals:  Vitals:   03/13/17 2215 03/13/17 2230  BP: 126/69 115/70  Pulse: 60 62  Resp: 11 14  Temp:    SpO2: 95% 95%    Last Pain:  Vitals:   03/13/17 2215  TempSrc:   PainSc: (P) 4    Pain Goal: Patients Stated Pain Goal: (tolerable per pt) (03/13/17 2150)               Shelton SilvasKevin D Ieshia Hatcher

## 2017-03-14 ENCOUNTER — Encounter (HOSPITAL_COMMUNITY): Payer: Self-pay | Admitting: Obstetrics & Gynecology

## 2017-03-30 ENCOUNTER — Other Ambulatory Visit: Payer: Commercial Managed Care - PPO

## 2017-04-02 ENCOUNTER — Encounter: Payer: Commercial Managed Care - PPO | Admitting: Advanced Practice Midwife

## 2017-06-12 ENCOUNTER — Ambulatory Visit (INDEPENDENT_AMBULATORY_CARE_PROVIDER_SITE_OTHER): Payer: Self-pay | Admitting: Family Medicine

## 2017-06-12 ENCOUNTER — Encounter: Payer: Self-pay | Admitting: Family Medicine

## 2017-06-12 VITALS — BP 135/83 | HR 55 | Ht 67.0 in | Wt 191.0 lb

## 2017-06-12 DIAGNOSIS — Z23 Encounter for immunization: Secondary | ICD-10-CM

## 2017-06-12 DIAGNOSIS — Z7185 Encounter for immunization safety counseling: Secondary | ICD-10-CM

## 2017-06-12 DIAGNOSIS — Z7189 Other specified counseling: Secondary | ICD-10-CM

## 2017-06-12 DIAGNOSIS — Z111 Encounter for screening for respiratory tuberculosis: Secondary | ICD-10-CM

## 2017-06-12 NOTE — Progress Notes (Signed)
   Subjective:    Patient ID: [name], female    DOB: 07/27/1980, 37 y.o.   MRN: [id number]  HPI 37 year old female comes in today for up-to-date vaccinations.  She has a new job working at [employer].  She is been there for 3 weeks and says she absolutely loves it.  She was previously working at [employer].  I do require an up-to-date PPD test as well as an up-to-date tetanus and hepatitis B vaccination series.  She also recently had emergency surgery for perforated fallopian tube.  She had a failed pregnancy in the tube and after trying the injections became acutely ill.  She is doing well now and has recovered well.   Review of Systems     Objective:   Physical Exam  Constitutional: She is oriented to person, place, and time. She appears well-developed and well-nourished.  HENT:  Head: Normocephalic and atraumatic.  Eyes: Conjunctivae and EOM are normal.  Cardiovascular: Normal rate.  Pulmonary/Chest: Effort normal.  Neurological: She is alert and oriented to person, place, and time.  Skin: Skin is dry. No pallor.  Psychiatric: She has a normal mood and affect. Her behavior is normal.  Vitals reviewed.     Assessment & Plan:  Tdap is up-to-date it was given September 16, 2015. Provided copy of recent Tdap fow work.   Hepatitis vaccine -discussed options and will start with Twinrix hepatitis A/B.  PPD skin test placed. F/U in 48 hour for read,

## 2017-06-14 ENCOUNTER — Ambulatory Visit (INDEPENDENT_AMBULATORY_CARE_PROVIDER_SITE_OTHER): Payer: Self-pay | Admitting: Family Medicine

## 2017-06-14 ENCOUNTER — Encounter: Payer: Self-pay | Admitting: Family Medicine

## 2017-06-14 DIAGNOSIS — Z111 Encounter for screening for respiratory tuberculosis: Secondary | ICD-10-CM

## 2017-06-14 LAB — TB SKIN TEST
INDURATION: 0 mm
TB Skin Test: NEGATIVE

## 2017-06-14 NOTE — Progress Notes (Signed)
Agree with documentation as above.   Horton Ellithorpe, MD  

## 2017-06-14 NOTE — Progress Notes (Signed)
   Subjective:    Patient ID: [name], female    DOB: 02/04/1981, 37 y.o.   MRN: [id number]  HPI Patient here to have PPD reading on right forearm.    Review of Systems     Objective:   Physical Exam        Assessment & Plan:  Area of PPD negative for inflammation and induration.

## 2017-07-10 ENCOUNTER — Ambulatory Visit: Payer: Self-pay

## 2019-02-11 IMAGING — US US OB COMP LESS 14 WK
1 series · 15 of 28 positions shown · non-contrast
Comparison: None.

CLINICAL DATA: Pelvic pain, cramping, and vaginal bleeding for 2
days. Gestational age by LMP of 5 weeks 6 days.

EXAM:
OBSTETRIC <14 WK US AND TRANSVAGINAL OB US
TECHNIQUE: Both transabdominal and transvaginal ultrasound examinations were
performed for complete evaluation of the gestation as well as the
maternal uterus, adnexal regions, and pelvic cul-de-sac.
Transvaginal technique was performed to assess early pregnancy.

[Series 1: us ob comp less 14 wk · 15 of 84 slices shown]
[im 1/84]
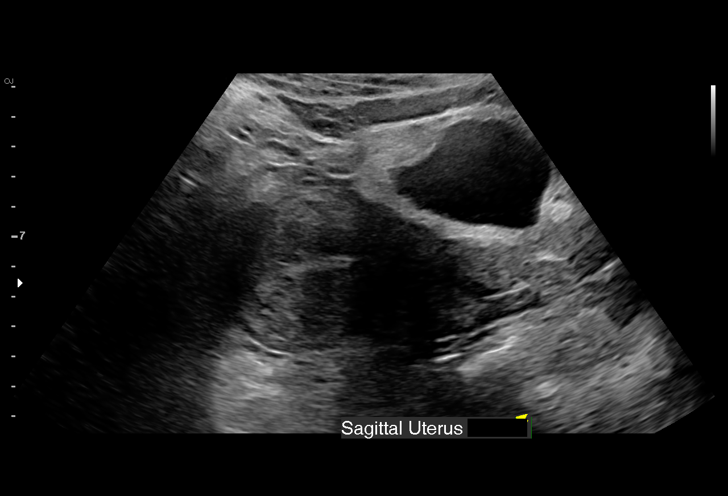
[im 7/84]
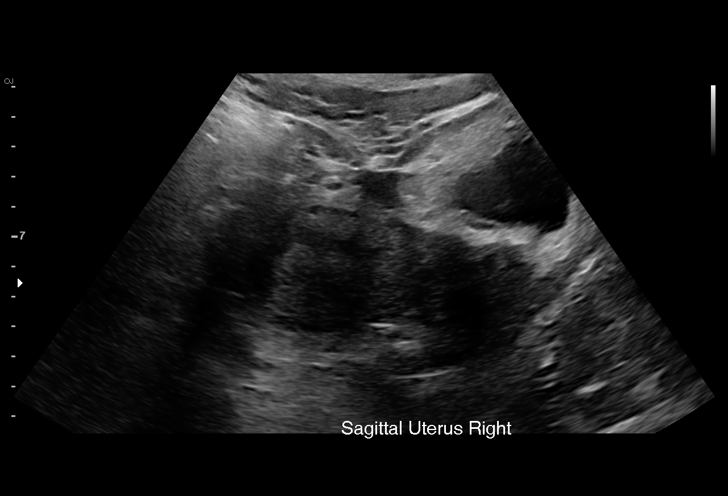
[im 13/84]
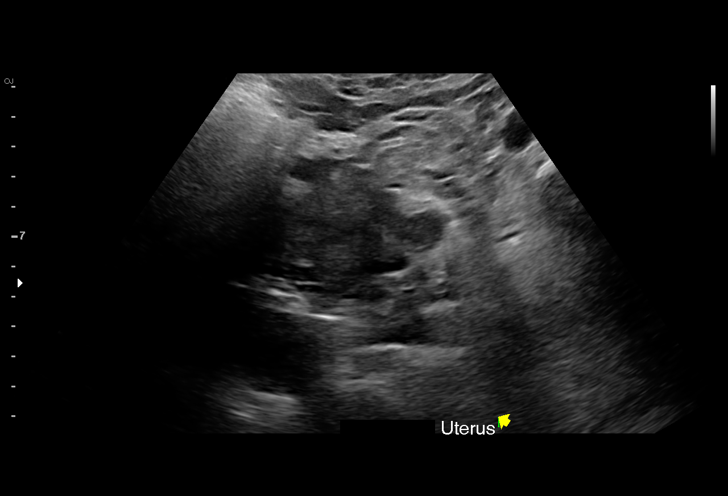
[im 19/84]
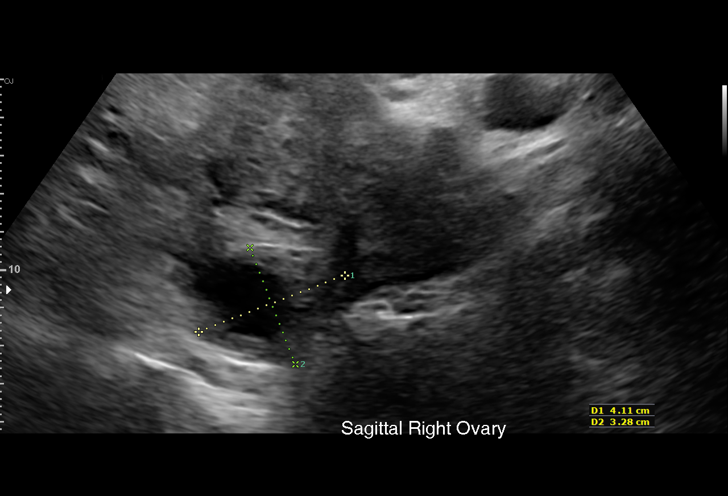
[im 25/84]
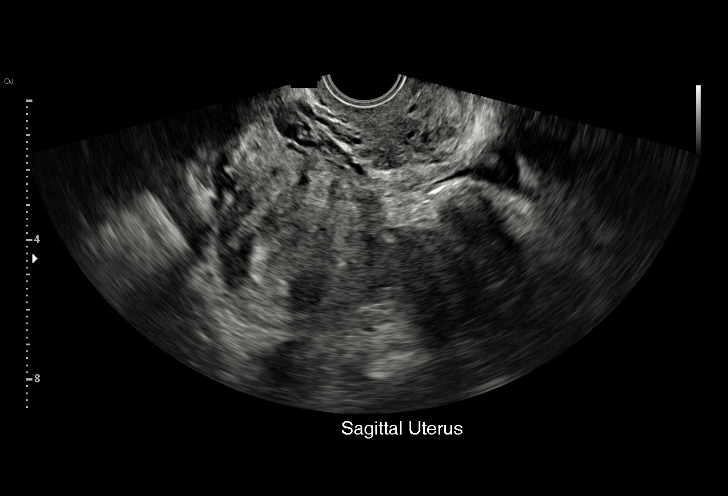
[im 31/84]
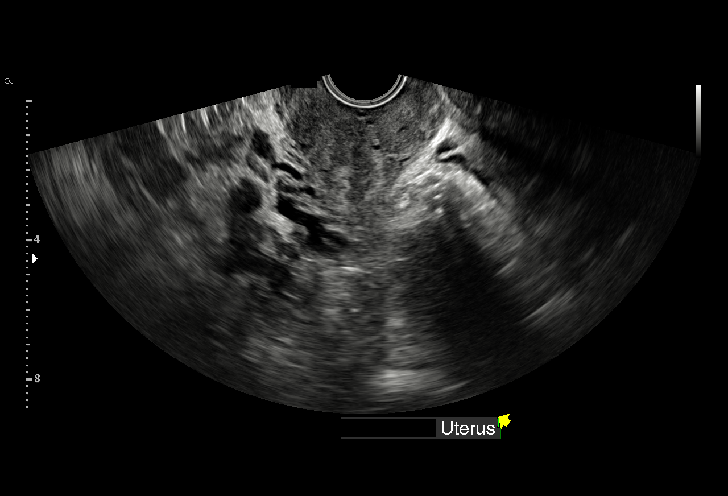
[im 37/84]
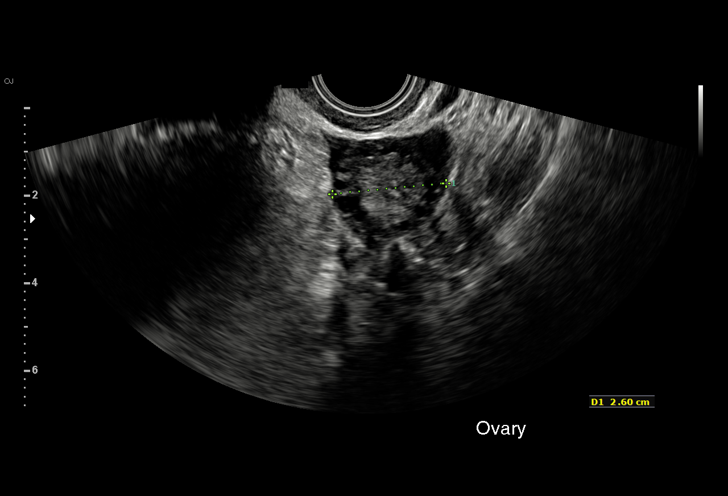
[im 44/84]
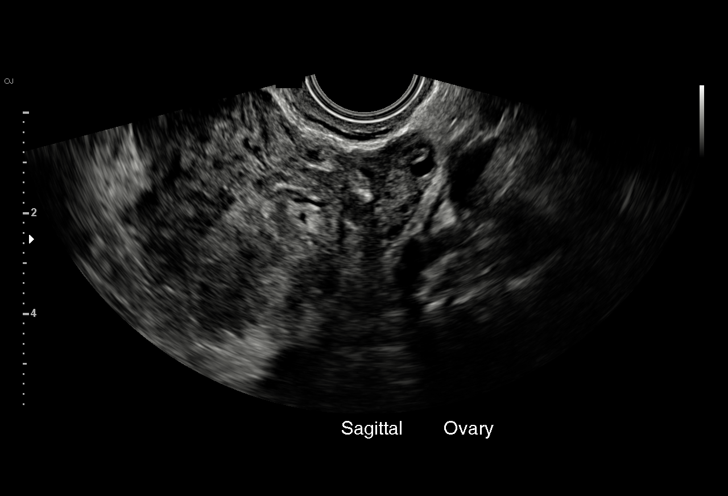
[im 47/84]
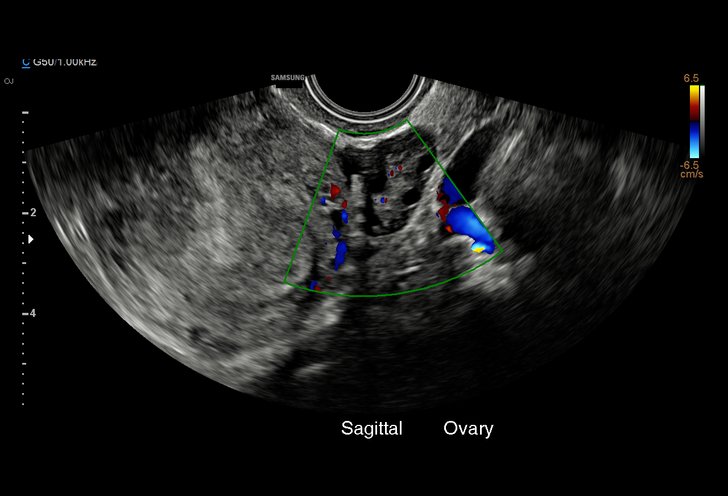
[im 53/84]
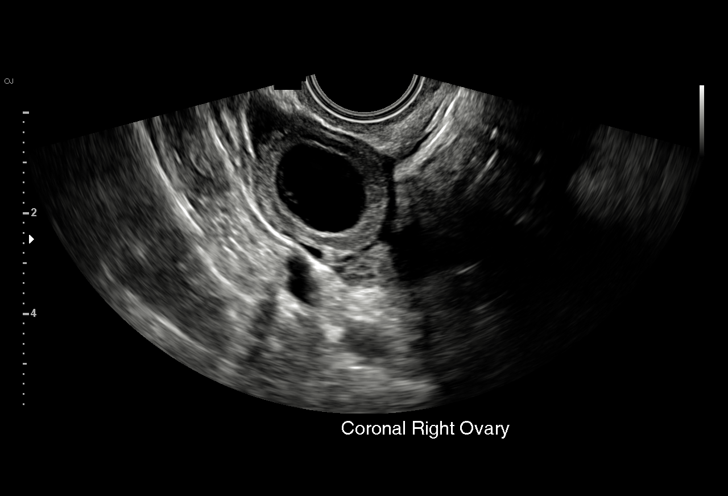
[im 59/84]
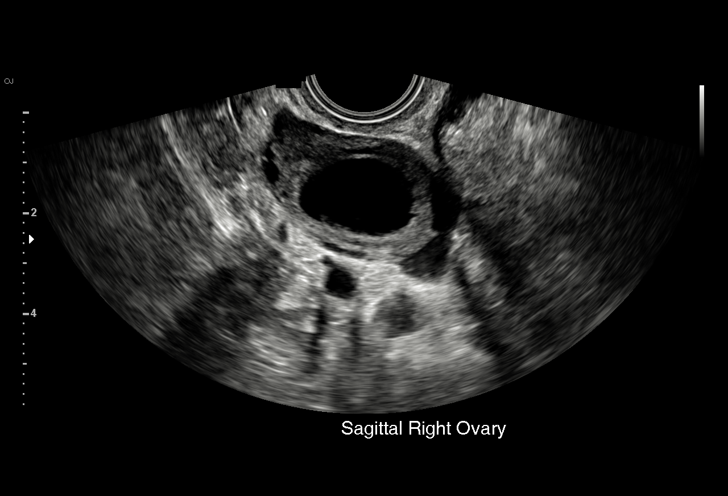
[im 65/84]
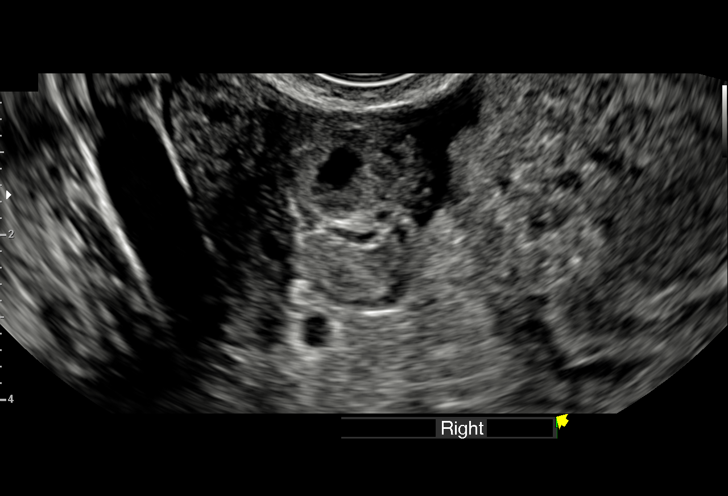
[im 71/84]
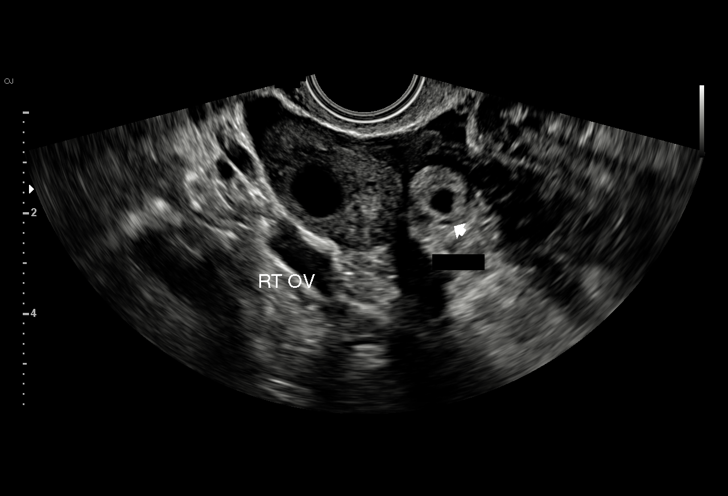
[im 77/84]
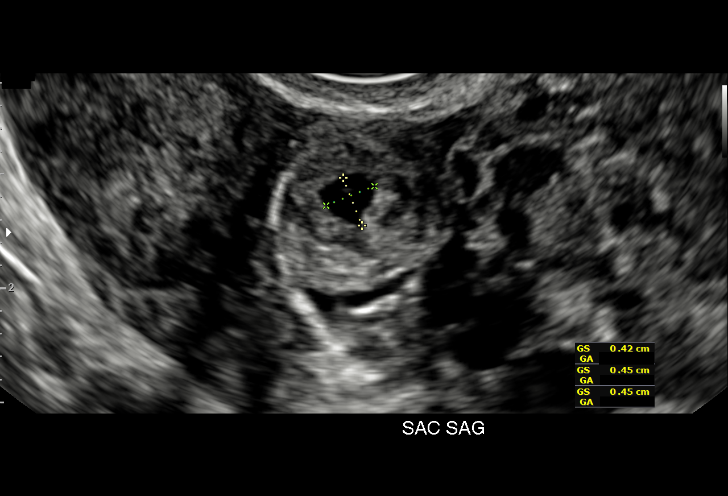
[im 84/84]
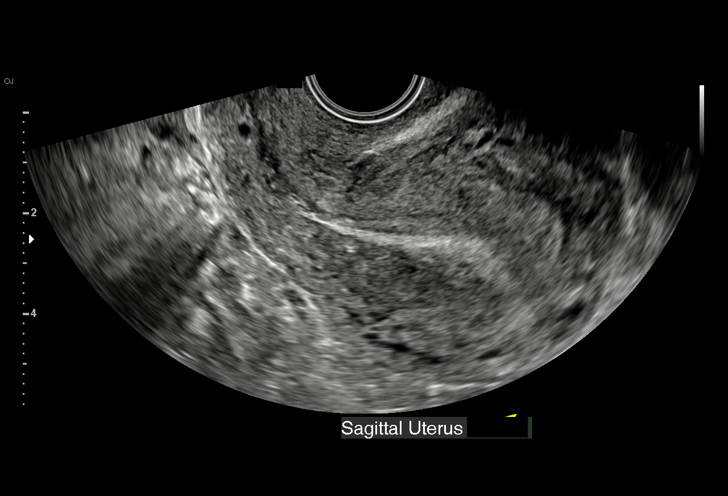

[15 of 28 positions shown; findings below may reference images not displayed]

FINDINGS: Intrauterine gestational sac: None

Maternal uterus/adnexae: Retroverted uterus. Endometrial thickness
measures 14 mm. Normal appearance of left ovary.

Right ovarian corpus luteum cyst is noted. A separate complex cystic
and solid mass is seen in the right adnexa which measures 2.5 x
by 1.8 cm, highly suspicious for ectopic pregnancy. Tiny amount of
simple free fluid seen in right adnexal region.
IMPRESSION: 2.5 cm complex right adnexal mass, highly suspicious for ectopic
pregnancy. Tiny amount of simple free fluid.

Critical Value/emergent results were called by telephone at the time
of interpretation on 03/06/2017 at [DATE] to [place] in [place], who
verbally acknowledged these results.

## 2019-03-10 DIAGNOSIS — U071 COVID-19: Secondary | ICD-10-CM | POA: Diagnosis not present

## 2019-03-10 DIAGNOSIS — Z7189 Other specified counseling: Secondary | ICD-10-CM | POA: Diagnosis not present

## 2019-06-23 ENCOUNTER — Ambulatory Visit (INDEPENDENT_AMBULATORY_CARE_PROVIDER_SITE_OTHER): Payer: BC Managed Care – PPO | Admitting: Family Medicine

## 2019-06-23 ENCOUNTER — Other Ambulatory Visit: Payer: Self-pay

## 2019-06-23 ENCOUNTER — Encounter: Payer: Self-pay | Admitting: Family Medicine

## 2019-06-23 ENCOUNTER — Other Ambulatory Visit (HOSPITAL_COMMUNITY)
Admission: RE | Admit: 2019-06-23 | Discharge: 2019-06-23 | Disposition: A | Discharge status: Home / Self Care | Payer: BC Managed Care – PPO | Source: Ambulatory Visit | Attending: Family Medicine | Admitting: Family Medicine

## 2019-06-23 VITALS — BP 127/72 | HR 63 | Ht 67.0 in | Wt 198.0 lb

## 2019-06-23 DIAGNOSIS — Z124 Encounter for screening for malignant neoplasm of cervix: Secondary | ICD-10-CM | POA: Diagnosis not present

## 2019-06-23 DIAGNOSIS — R319 Hematuria, unspecified: Secondary | ICD-10-CM

## 2019-06-23 DIAGNOSIS — R8290 Unspecified abnormal findings in urine: Secondary | ICD-10-CM

## 2019-06-23 DIAGNOSIS — Z0000 Encounter for general adult medical examination without abnormal findings: Secondary | ICD-10-CM

## 2019-06-23 DIAGNOSIS — R102 Pelvic and perineal pain: Secondary | ICD-10-CM

## 2019-06-23 LAB — WET PREP FOR TRICH, YEAST, CLUE
MICRO NUMBER:: 10225357
Specimen Quality: ADEQUATE

## 2019-06-23 LAB — POCT URINALYSIS DIP (CLINITEK)
Bilirubin, UA: NEGATIVE
Glucose, UA: NEGATIVE mg/dL
Ketones, POC UA: NEGATIVE mg/dL
Nitrite, UA: NEGATIVE
POC PROTEIN,UA: NEGATIVE
Spec Grav, UA: 1.015 (ref 1.010–1.025)
Urobilinogen, UA: 0.2 E.U./dL
pH, UA: 6 (ref 5.0–8.0)

## 2019-06-23 NOTE — Progress Notes (Signed)
Subjective:     [name] is a 39 y.o. female and is here for a comprehensive physical exam. The patient reports no problems.  She is doing well.  She feels like her last Pap smear was around 2017.  Last tetanus shot was around that time as well.  She did let me know that she had a maternal aunt diagnosed with breast cancer in her mid 52s.  Though she also had diabetes and is now deceased.  She did mention that for years right around her menstrual cycle she will does feel a lot of pressure and pelvic aching right before her period starts and then sometimes a couple days into her cycle.  Once she even had some burning with urination.  She says she tends to hold her urine frequently.  She will also get a little bit of low back pain around the menstrual cycle time as well.  She has not noticed any blood in the urine.  Social History   Socioeconomic History  . Marital status: Single    Spouse name: Not on file  . Number of children: 3  . Years of education: Not on file  . Highest education level: Not on file  Occupational History  . Occupation: Occupational psychologist.   Tobacco Use  . Smoking status: Never Smoker  . Smokeless tobacco: Never Used  Substance and Sexual Activity  . Alcohol use: Yes    Alcohol/week: 0.0 standard drinks    Comment: socially  . Drug use: No  . Sexual activity: Yes    Partners: Male    Birth control/protection: None    Comment: single, 3 kids, single mom, no regular exercise  Other Topics Concern  . Not on file  Social History Narrative   1-2 caffeinated drinks per day. No regular exercise.     Social Determinants of Health   Financial Resource Strain:   . Difficulty of Paying Living Expenses: Not on file  Food Insecurity:   . Worried About Charity fundraiser in the Last Year: Not on file  . Ran Out of Food in the Last Year: Not on file  Transportation Needs:   . Lack of Transportation (Medical): Not on file  . Lack of Transportation (Non-Medical): Not  on file  Physical Activity:   . Days of Exercise per Week: Not on file  . Minutes of Exercise per Session: Not on file  Stress:   . Feeling of Stress : Not on file  Social Connections:   . Frequency of Communication with Friends and Family: Not on file  . Frequency of Social Gatherings with Friends and Family: Not on file  . Attends Religious Services: Not on file  . Active Member of Clubs or Organizations: Not on file  . Attends Archivist Meetings: Not on file  . Marital Status: Not on file  Intimate Partner Violence:   . Fear of Current or Ex-Partner: Not on file  . Emotionally Abused: Not on file  . Physically Abused: Not on file  . Sexually Abused: Not on file   Health Maintenance  Topic Date Due  . PAP SMEAR-Modifier  02/14/2018  . INFLUENZA VACCINE  11/16/2018  . TETANUS/TDAP  09/15/2025  . HIV Screening  Completed    The following portions of the patient's history were reviewed and updated as appropriate: allergies, current medications, past family history, past medical history, past social history, past surgical history and problem list.  Review of Systems A comprehensive review of  systems was negative.   Objective:    BP 127/72   Pulse 63   Ht 5\' 7"  (1.702 m)   Wt 198 lb (89.8 kg)   LMP 06/10/2019   SpO2 100%   BMI 31.01 kg/m  General appearance: alert, cooperative and appears stated age Head: Normocephalic, without obvious abnormality, atraumatic Eyes: conj clear, EOMI, PEERLA Ears: normal TM's and external ear canals both ears Nose: Nares normal. Septum midline. Mucosa normal. No drainage or sinus tenderness. Throat: lips, mucosa, and tongue normal; teeth and gums normal Neck: no adenopathy, no carotid bruit, no JVD, supple, symmetrical, trachea midline and thyroid not enlarged, symmetric, no tenderness/mass/nodules Back: symmetric, no curvature. ROM normal. No CVA tenderness. Lungs: clear to auscultation bilaterally Breasts: normal appearance,  no masses or tenderness Heart: regular rate and rhythm, S1, S2 normal, no murmur, click, rub or gallop Abdomen: soft, non-tender; bowel sounds normal; no masses,  no organomegaly Pelvic: cervix normal in appearance, external genitalia normal, no adnexal masses or tenderness, no cervical motion tenderness, rectovaginal septum normal, uterus normal size, shape, and consistency and vagina normal without discharge Extremities: extremities normal, atraumatic, no cyanosis or edema Pulses: 2+ and symmetric Skin: Skin color, texture, turgor normal. No rashes or lesions Lymph nodes: Cervical, supraclavicular, and axillary nodes normal. Neurologic: Alert and oriented X 3, normal strength and tone. Normal symmetric reflexes. Normal coordination and gait    Assessment:    Healthy female exam.      Plan:     See After Visit Summary for Counseling Recommendations   Keep up a regular exercise program and make sure you are eating a healthy diet Try to eat 4 servings of dairy a day, or if you are lactose intolerant take a calcium with vitamin D daily.  Your vaccines are up to date.  Pap smear performed.  Will call with results once available.  Also did a wet prep as well. We will get updated labs including screening lipids and metabolic panel.  Pelvic pain/pressure around her menstrual cycle-this could be more related to the uterus and drop in hormone levels.  Certainly could be related to her bladder as well.  We will go ahead and check a urinalysis today as well as culture since it did show some trace blood.  Will call with results once available.

## 2019-06-24 LAB — CBC
HCT: 34 % — ABNORMAL LOW (ref 35.0–45.0)
Hemoglobin: 11.3 g/dL — ABNORMAL LOW (ref 11.7–15.5)
MCH: 26.2 pg — ABNORMAL LOW (ref 27.0–33.0)
MCHC: 33.2 g/dL (ref 32.0–36.0)
MCV: 78.7 fL — ABNORMAL LOW (ref 80.0–100.0)
MPV: 11.5 fL (ref 7.5–12.5)
Platelets: 263 10*3/uL (ref 140–400)
RBC: 4.32 10*6/uL (ref 3.80–5.10)
RDW: 13.1 % (ref 11.0–15.0)
WBC: 8.9 10*3/uL (ref 3.8–10.8)

## 2019-06-24 LAB — WET PREP FOR TRICH, YEAST, CLUE

## 2019-06-24 LAB — COMPLETE METABOLIC PANEL WITH GFR
AG Ratio: 1.5 (calc) (ref 1.0–2.5)
ALT: 10 U/L (ref 6–29)
AST: 14 U/L (ref 10–30)
Albumin: 4.1 g/dL (ref 3.6–5.1)
Alkaline phosphatase (APISO): 57 U/L (ref 31–125)
BUN: 9 mg/dL (ref 7–25)
CO2: 26 mmol/L (ref 20–32)
Calcium: 8.9 mg/dL (ref 8.6–10.2)
Chloride: 104 mmol/L (ref 98–110)
Creat: 0.57 mg/dL (ref 0.50–1.10)
GFR, Est African American: 137 mL/min/{1.73_m2} (ref >=60)
GFR, Est Non African American: 118 mL/min/{1.73_m2} (ref >=60)
Globulin: 2.7 g/dL (calc) (ref 1.9–3.7)
Glucose, Bld: 82 mg/dL (ref 65–99)
Potassium: 3.9 mmol/L (ref 3.5–5.3)
Sodium: 138 mmol/L (ref 135–146)
Total Bilirubin: 0.5 mg/dL (ref 0.2–1.2)
Total Protein: 6.8 g/dL (ref 6.1–8.1)

## 2019-06-24 LAB — LIPID PANEL
Cholesterol: 221 mg/dL — ABNORMAL HIGH (ref <200)
HDL: 74 mg/dL (ref >=50)
LDL Cholesterol (Calc): 131 mg/dL (calc) — ABNORMAL HIGH
Non-HDL Cholesterol (Calc): 147 mg/dL (calc) — ABNORMAL HIGH (ref <130)
Total CHOL/HDL Ratio: 3 (calc) (ref <5.0)
Triglycerides: 69 mg/dL (ref <150)

## 2019-06-24 LAB — TSH: TSH: 1.25 mIU/L

## 2019-06-25 LAB — URINE CULTURE
MICRO NUMBER:: 10228506
Result:: NO GROWTH
SPECIMEN QUALITY:: ADEQUATE

## 2019-06-25 LAB — URINALYSIS W MICROSCOPIC + REFLEX CULTURE
Bacteria, UA: NONE SEEN /HPF
Bilirubin Urine: NEGATIVE
Glucose, UA: NEGATIVE
Hgb urine dipstick: NEGATIVE
Hyaline Cast: NONE SEEN /LPF
Ketones, ur: NEGATIVE
Nitrites, Initial: NEGATIVE
Protein, ur: NEGATIVE
RBC / HPF: NONE SEEN /HPF (ref 0–2)
Specific Gravity, Urine: 1.005 (ref 1.001–1.03)
pH: 6 (ref 5.0–8.0)

## 2019-06-25 LAB — CULTURE INDICATED

## 2019-06-26 LAB — CYTOLOGY - PAP
Comment: NEGATIVE
Diagnosis: NEGATIVE
Diagnosis: REACTIVE
High risk HPV: NEGATIVE

## 2019-06-26 NOTE — Progress Notes (Signed)
Call patient: Your Pap smear is normal. Repeat in 5 years.

## 2019-10-07 ENCOUNTER — Encounter: Payer: Self-pay | Admitting: Family Medicine

## 2019-10-07 ENCOUNTER — Ambulatory Visit (INDEPENDENT_AMBULATORY_CARE_PROVIDER_SITE_OTHER): Payer: BC Managed Care – PPO | Admitting: Family Medicine

## 2019-10-07 VITALS — BP 129/76 | HR 63 | Ht 67.0 in | Wt 204.0 lb

## 2019-10-07 DIAGNOSIS — N644 Mastodynia: Secondary | ICD-10-CM | POA: Diagnosis not present

## 2019-10-07 DIAGNOSIS — R21 Rash and other nonspecific skin eruption: Secondary | ICD-10-CM | POA: Diagnosis not present

## 2019-10-07 MED ORDER — DOXYCYCLINE HYCLATE 100 MG PO TABS: 100.0000 mg | ORAL_TABLET | Freq: Two times a day (BID) | ORAL | 0 refills | Status: DC

## 2019-10-07 MED ORDER — CLOTRIMAZOLE-BETAMETHASONE 1-0.05 % EX CREA: 1.0000 "application " | TOPICAL_CREAM | Freq: Two times a day (BID) | CUTANEOUS | 1 refills | Status: DC | PRN

## 2019-10-07 NOTE — Addendum Note (Signed)
Addended by: Deno Etienne on: 10/07/2019 05:10 PM   Modules accepted: Orders

## 2019-10-07 NOTE — Progress Notes (Signed)
Established Patient Office Visit  Subjective:  Patient ID: [name], female    DOB: 1981/02/05  Age: 39 y.o. MRN: [id number]  CC:  Chief Complaint  Patient presents with  . breast throbbing    HPI Shaana L Barrie presents for breast pain and irritation that is been present for about 6 weeks.  It is on her left breast.  She is noticed a rash right around the nipple she has been applying her son's mupirocin ointment to see if it would help but has not noticed a big improvement.  Though she admits she has not been using it consistently.  She denies any changes to any soaps, detergents etc.  She uses [employer] lotion but has been using it for years.  She had not had a recent Covid vaccine she has never had a mammogram before.  She said over the weekend she actually started to feel like her breast just felt heavy and started getting some sharp shooting pains through the breast area.  Family history includes a maternal aunt with breast cancer at age 89.  Past Medical History:  Diagnosis Date  . Abnormal pap    cryo  . Anemia   . Chest pain   . H/O anxiety disorder   . Preeclampsia in postpartum period 11/27/2015  . Vaginal Pap smear, abnormal     Past Surgical History:  Procedure Laterality Date  . cervical cryo    . LAPAROSCOPY N/A 03/13/2017   Procedure: LAPAROSCOPY OPERATIVE;  Surgeon: Adam Phenix, MD;  Location: [facility];  Service: Gynecology;  Laterality: N/A;  . UNILATERAL SALPINGECTOMY Right 03/13/2017   Procedure: UNILATERAL SALPINGECTOMY;  Surgeon: Adam Phenix, MD;  Location: [facility];  Service: Gynecology;  Laterality: Right;  . WISDOM TOOTH EXTRACTION      Family History  Problem Relation Age of Onset  . Hypertension Mother   . Heart failure Mother   . Diabetes Mother   . Hypertension Father   . Diabetes Maternal Aunt   . Breast cancer Maternal Aunt 45  . Hyperlipidemia Other        family history    Social History   Socioeconomic History  .  Marital status: Single    Spouse name: Not on file  . Number of children: 3  . Years of education: Not on file  . Highest education level: Not on file  Occupational History  . Occupation: Associate Professor.   Tobacco Use  . Smoking status: Never Smoker  . Smokeless tobacco: Never Used  Substance and Sexual Activity  . Alcohol use: Yes    Alcohol/week: 0.0 standard drinks    Comment: socially  . Drug use: No  . Sexual activity: Yes    Partners: Male    Birth control/protection: None    Comment: single, 3 kids, single mom, no regular exercise  Other Topics Concern  . Not on file  Social History Narrative   1-2 caffeinated drinks per day. No regular exercise.     Social Determinants of Health   Financial Resource Strain:   . Difficulty of Paying Living Expenses:   Food Insecurity:   . Worried About Programme researcher, broadcasting/film/video in the Last Year:   . Barista in the Last Year:   Transportation Needs:   . Freight forwarder (Medical):   Marland Kitchen Lack of Transportation (Non-Medical):   Physical Activity:   . Days of Exercise per Week:   . Minutes of Exercise  per Session:   Stress:   . Feeling of Stress :   Social Connections:   . Frequency of Communication with Friends and Family:   . Frequency of Social Gatherings with Friends and Family:   . Attends Religious Services:   . Active Member of Clubs or Organizations:   . Attends Archivist Meetings:   Marland Kitchen Marital Status:   Intimate Partner Violence:   . Fear of Current or Ex-Partner:   . Emotionally Abused:   Marland Kitchen Physically Abused:   . Sexually Abused:     Outpatient Medications Prior to Visit  Medication Sig Dispense Refill  . potassium chloride (KLOR-CON) 10 MEQ tablet Take 1 tablet by mouth in the morning and at bedtime.    Marland Kitchen ibuprofen (ADVIL,MOTRIN) 600 MG tablet Take 1 tablet (600 mg total) by mouth every 6 (six) hours as needed. 30 tablet 0  . potassium chloride (K-DUR) 10 MEQ tablet Take 1 tablet by mouth 2 (two)  times daily.     No facility-administered medications prior to visit.    No Known Allergies  ROS Review of Systems    Objective:    Physical Exam Vitals reviewed.  Constitutional:      Appearance: She is well-developed.  HENT:     Head: Normocephalic and atraumatic.  Eyes:     Conjunctiva/sclera: Conjunctivae normal.  Cardiovascular:     Rate and Rhythm: Normal rate.  Pulmonary:     Effort: Pulmonary effort is normal.  Chest:       Comments: Left breast with inverted nipple which is not new.  No palpable mass or nipple discharge.  She had some flaking and scale around the nipple.  Please see picture below above. Skin:    General: Skin is dry.     Coloration: Skin is not pale.  Neurological:     Mental Status: She is alert and oriented to person, place, and time.  Psychiatric:        Behavior: Behavior normal.     BP 129/76   Pulse 63   Ht 5\' 7"  (1.702 m)   Wt 204 lb (92.5 kg)   SpO2 100%   BMI 31.95 kg/m  Wt Readings from Last 3 Encounters:  10/07/19 204 lb (92.5 kg)  06/23/19 198 lb (89.8 kg)  06/12/17 191 lb (86.6 kg)     Health Maintenance Due  Topic Date Due  . Hepatitis C Screening  Never done  . COVID-19 Vaccine (1) Never done    There are no preventive care reminders to display for this patient.  Lab Results  Component Value Date   TSH 1.25 06/23/2019   Lab Results  Component Value Date   WBC 8.9 06/23/2019   HGB 11.3 (L) 06/23/2019   HCT 34.0 (L) 06/23/2019   MCV 78.7 (L) 06/23/2019   PLT 263 06/23/2019   Lab Results  Component Value Date   NA 138 06/23/2019   K 3.9 06/23/2019   CO2 26 06/23/2019   GLUCOSE 82 06/23/2019   BUN 9 06/23/2019   CREATININE 0.57 06/23/2019   BILITOT 0.5 06/23/2019   ALKPHOS 64 03/06/2017   AST 14 06/23/2019   ALT 10 06/23/2019   PROT 6.8 06/23/2019   ALBUMIN 3.6 03/06/2017   CALCIUM 8.9 06/23/2019   ANIONGAP 7 03/06/2017   Lab Results  Component Value Date   CHOL 221 (H) 06/23/2019   Lab  Results  Component Value Date   HDL 74 06/23/2019   Lab Results  Component  Value Date   LDLCALC 131 (H) 06/23/2019   Lab Results  Component Value Date   TRIG 69 06/23/2019   Lab Results  Component Value Date   CHOLHDL 3.0 06/23/2019   Lab Results  Component Value Date   HGBA1C 5.5 01/26/2016      Assessment & Plan:   Problem List Items Addressed This Visit    None    Visit Diagnoses    Breast pain, left    -  Primary   Relevant Orders   MM Digital Diagnostic Bilat   Rash, skin       Relevant Orders   MM Digital Diagnostic Bilat      Left breast pain with skin rash-could be a fungal rash or possible early mastitis since she started to feel some heaviness and actual pain over the weekend.  Will treat with oral doxycycline and topical Lotrisone cream.  We will go ahead and get diagnostic mammogram for further work-up as well.  Meds ordered this encounter  Medications  . doxycycline (VIBRA-TABS) 100 MG tablet    Sig: Take 1 tablet (100 mg total) by mouth 2 (two) times daily.    Dispense:  20 tablet    Refill:  0  . clotrimazole-betamethasone (LOTRISONE) cream    Sig: Apply 1 application topically 2 (two) times daily as needed.    Dispense:  45 g    Refill:  1    Follow-up: No follow-ups on file.    Nani Gasser, MD

## 2019-10-07 NOTE — Progress Notes (Signed)
Her sxs began 1.5 months ago. She reports that it is mostly on her L breast and its very irritated. She has been using some mupirocin ointment. She stated that this is mostly around her areola  .

## 2019-10-09 ENCOUNTER — Other Ambulatory Visit: Payer: Self-pay | Admitting: Family Medicine

## 2019-10-09 DIAGNOSIS — R21 Rash and other nonspecific skin eruption: Secondary | ICD-10-CM

## 2019-10-09 DIAGNOSIS — N644 Mastodynia: Secondary | ICD-10-CM

## 2019-10-09 LAB — FUNGAL STAIN
FUNGAL SMEAR:: NONE SEEN
MICRO NUMBER:: 10620735
SPECIMEN QUALITY:: ADEQUATE

## 2019-10-27 ENCOUNTER — Other Ambulatory Visit: Payer: BC Managed Care – PPO

## 2019-11-20 DIAGNOSIS — R21 Rash and other nonspecific skin eruption: Secondary | ICD-10-CM | POA: Diagnosis not present

## 2019-11-20 DIAGNOSIS — N644 Mastodynia: Secondary | ICD-10-CM | POA: Diagnosis not present

## 2019-11-20 LAB — HM MAMMOGRAPHY

## 2019-11-25 ENCOUNTER — Encounter: Payer: Self-pay | Admitting: Family Medicine

## 2019-12-04 DIAGNOSIS — Z0000 Encounter for general adult medical examination without abnormal findings: Secondary | ICD-10-CM | POA: Diagnosis not present

## 2019-12-04 DIAGNOSIS — R8290 Unspecified abnormal findings in urine: Secondary | ICD-10-CM | POA: Diagnosis not present

## 2019-12-06 LAB — SARS-COV-2 RNA,(COVID-19) QUALITATIVE NAAT: SARS CoV2 RNA: NOT DETECTED

## 2019-12-06 LAB — WET PREP FOR TRICH, YEAST, CLUE

## 2019-12-15 DIAGNOSIS — R519 Headache, unspecified: Secondary | ICD-10-CM | POA: Diagnosis not present

## 2019-12-15 DIAGNOSIS — Z20822 Contact with and (suspected) exposure to covid-19: Secondary | ICD-10-CM | POA: Diagnosis not present

## 2019-12-15 DIAGNOSIS — R0989 Other specified symptoms and signs involving the circulatory and respiratory systems: Secondary | ICD-10-CM | POA: Diagnosis not present

## 2019-12-18 DIAGNOSIS — Z20828 Contact with and (suspected) exposure to other viral communicable diseases: Secondary | ICD-10-CM | POA: Diagnosis not present

## 2020-01-07 ENCOUNTER — Telehealth: Payer: Self-pay

## 2020-01-07 NOTE — Telephone Encounter (Signed)
Pt called requesting a med refill for potassium chloride. Rx written by historical provider.

## 2020-01-08 MED ORDER — POTASSIUM CHLORIDE ER 10 MEQ PO TBCR
10.0000 meq | EXTENDED_RELEASE_TABLET | Freq: Two times a day (BID) | ORAL | 1 refills | Status: DC
Start: 2020-01-08 — End: 2021-05-10

## 2020-01-08 NOTE — Telephone Encounter (Signed)
Task completed. Pt has been updated of refill for potassium rx. Pt aware to contact the clinic if she get new symptoms taking the medication. No other inquiries during the call.

## 2020-01-08 NOTE — Telephone Encounter (Signed)
Med sent.

## 2020-02-03 ENCOUNTER — Encounter: Payer: Self-pay | Admitting: Family Medicine

## 2020-02-03 ENCOUNTER — Ambulatory Visit (INDEPENDENT_AMBULATORY_CARE_PROVIDER_SITE_OTHER): Payer: BC Managed Care – PPO | Admitting: Family Medicine

## 2020-02-03 VITALS — BP 125/48 | HR 73 | Ht 67.0 in | Wt 212.0 lb

## 2020-02-03 DIAGNOSIS — R300 Dysuria: Secondary | ICD-10-CM

## 2020-02-03 DIAGNOSIS — N760 Acute vaginitis: Secondary | ICD-10-CM | POA: Diagnosis not present

## 2020-02-03 LAB — POCT URINALYSIS DIP (CLINITEK)
Bilirubin, UA: NEGATIVE
Glucose, UA: NEGATIVE mg/dL
Ketones, POC UA: NEGATIVE mg/dL
Nitrite, UA: NEGATIVE
Spec Grav, UA: <=1.005 — AB (ref 1.010–1.025)
Urobilinogen, UA: 0.2 E.U./dL
pH, UA: 6 (ref 5.0–8.0)

## 2020-02-03 MED ORDER — FLUCONAZOLE 150 MG PO TABS: 150.0000 mg | ORAL_TABLET | Freq: Once | ORAL | 1 refills | Status: AC

## 2020-02-03 NOTE — Progress Notes (Signed)
Established Patient Office Visit  Subjective:  Patient ID: [name], female    DOB: 1980-07-26  Age: 39 y.o. MRN: [id number]  CC:  Chief Complaint  Patient presents with  . vaginal irritation    HPI [name] presents for pain in the vaginal area.  Still has discomfort and irritation towards the end of her menstrual cycle.  Its been going on for years but this time it was worse and did not seem to be clearing up as usual.  She says usually wearing the tampons and pads cause a lot of irritation and discomfort.  Today she was extremely uncomfortable.  She felt like it was starting to turn into a urinary tract infection as it was difficult and painful to urinate. She also has some leftover metronidazole and has also been using that as needed around the time of her cycle. It does seem to help.  Pap smear is up to date.   Past Medical History:  Diagnosis Date  . Abnormal pap    cryo  . Anemia   . Chest pain   . H/O anxiety disorder   . Preeclampsia in postpartum period 11/27/2015  . Vaginal Pap smear, abnormal     Past Surgical History:  Procedure Laterality Date  . cervical cryo    . LAPAROSCOPY N/A 03/13/2017   Procedure: LAPAROSCOPY OPERATIVE;  Surgeon: Adam Phenix, MD;  Location: [facility];  Service: Gynecology;  Laterality: N/A;  . UNILATERAL SALPINGECTOMY Right 03/13/2017   Procedure: UNILATERAL SALPINGECTOMY;  Surgeon: Adam Phenix, MD;  Location: [facility];  Service: Gynecology;  Laterality: Right;  . WISDOM TOOTH EXTRACTION      Family History  Problem Relation Age of Onset  . Hypertension Mother   . Heart failure Mother   . Diabetes Mother   . Hypertension Father   . Diabetes Maternal Aunt   . Breast cancer Maternal Aunt 45  . Hyperlipidemia Other        family history    Social History   Socioeconomic History  . Marital status: Single    Spouse name: Not on file  . Number of children: 3  . Years of education: Not on file  . Highest  education level: Not on file  Occupational History  . Occupation: Associate Professor.   Tobacco Use  . Smoking status: Never Smoker  . Smokeless tobacco: Never Used  Substance and Sexual Activity  . Alcohol use: Yes    Alcohol/week: 0.0 standard drinks    Comment: socially  . Drug use: No  . Sexual activity: Yes    Partners: Male    Birth control/protection: None    Comment: single, 3 kids, single mom, no regular exercise  Other Topics Concern  . Not on file  Social History Narrative   1-2 caffeinated drinks per day. No regular exercise.     Social Determinants of Health   Financial Resource Strain:   . Difficulty of Paying Living Expenses: Not on file  Food Insecurity:   . Worried About Programme researcher, broadcasting/film/video in the Last Year: Not on file  . Ran Out of Food in the Last Year: Not on file  Transportation Needs:   . Lack of Transportation (Medical): Not on file  . Lack of Transportation (Non-Medical): Not on file  Physical Activity:   . Days of Exercise per Week: Not on file  . Minutes of Exercise per Session: Not on file  Stress:   . Feeling of Stress :  Not on file  Social Connections:   . Frequency of Communication with Friends and Family: Not on file  . Frequency of Social Gatherings with Friends and Family: Not on file  . Attends Religious Services: Not on file  . Active Member of Clubs or Organizations: Not on file  . Attends Banker Meetings: Not on file  . Marital Status: Not on file  Intimate Partner Violence:   . Fear of Current or Ex-Partner: Not on file  . Emotionally Abused: Not on file  . Physically Abused: Not on file  . Sexually Abused: Not on file    Outpatient Medications Prior to Visit  Medication Sig Dispense Refill  . potassium chloride (KLOR-CON) 10 MEQ tablet Take 1 tablet (10 mEq total) by mouth in the morning and at bedtime. 180 tablet 1  . amitriptyline (ELAVIL) 50 MG tablet Take 1 tablet (50 mg total) by mouth at bedtime. 30 tablet 1   . clotrimazole-betamethasone (LOTRISONE) cream Apply 1 application topically 2 (two) times daily as needed. 45 g 1  . doxycycline (VIBRA-TABS) 100 MG tablet Take 1 tablet (100 mg total) by mouth 2 (two) times daily. 20 tablet 0  . traZODone (DESYREL) 50 MG tablet Take 50 mg by mouth at bedtime.       No facility-administered medications prior to visit.    No Known Allergies  ROS Review of Systems    Objective:    Physical Exam Vitals reviewed.  Constitutional:      Appearance: She is well-developed.  HENT:     Head: Normocephalic and atraumatic.  Eyes:     Conjunctiva/sclera: Conjunctivae normal.  Cardiovascular:     Rate and Rhythm: Normal rate.  Pulmonary:     Effort: Pulmonary effort is normal.  Genitourinary:    Comments: Labia appear to be normal. Introitus appears to be erythematous but no distinct rash. Vaginal exam revealed thickened white discharge. No lesions. Skin:    General: Skin is dry.     Coloration: Skin is not pale.  Neurological:     Mental Status: She is alert and oriented to person, place, and time.  Psychiatric:        Behavior: Behavior normal.     BP (!) 125/48   Pulse 73   Ht 5\' 7"  (1.702 m)   Wt 212 lb (96.2 kg)   LMP 01/26/2020 (Exact Date)   SpO2 100%   Breastfeeding No   BMI 33.20 kg/m  Wt Readings from Last 3 Encounters:  02/03/20 212 lb (96.2 kg)  10/07/19 204 lb (92.5 kg)  06/23/19 198 lb (89.8 kg)     Health Maintenance Due  Topic Date Due  . Hepatitis C Screening  Never done  . COVID-19 Vaccine (1) Never done  . INFLUENZA VACCINE  11/16/2019    There are no preventive care reminders to display for this patient.  Lab Results  Component Value Date   TSH 1.25 06/23/2019   Lab Results  Component Value Date   WBC 8.9 06/23/2019   HGB 11.3 (L) 06/23/2019   HCT 34.0 (L) 06/23/2019   MCV 78.7 (L) 06/23/2019   PLT 263 06/23/2019   Lab Results  Component Value Date   NA 138 06/23/2019   K 3.9 06/23/2019   CO2 26  06/23/2019   GLUCOSE 82 06/23/2019   BUN 9 06/23/2019   CREATININE 0.57 06/23/2019   BILITOT 0.5 06/23/2019   ALKPHOS 64 03/06/2017   AST 14 06/23/2019   ALT 10 06/23/2019  PROT 6.8 06/23/2019   ALBUMIN 3.6 03/06/2017   CALCIUM 8.9 06/23/2019   ANIONGAP 7 03/06/2017   Lab Results  Component Value Date   CHOL 221 (H) 06/23/2019   Lab Results  Component Value Date   HDL 74 06/23/2019   Lab Results  Component Value Date   LDLCALC 131 (H) 06/23/2019   Lab Results  Component Value Date   TRIG 69 06/23/2019   Lab Results  Component Value Date   CHOLHDL 3.0 06/23/2019   Lab Results  Component Value Date   HGBA1C 5.5 01/26/2016      Assessment & Plan:   Problem List Items Addressed This Visit    None    Visit Diagnoses    Acute vaginitis    -  Primary   Relevant Orders   WET PREP FOR TRICH, YEAST, CLUE   Urine Culture   POCT URINALYSIS DIP (CLINITEK) (Completed)   Dysuria       Relevant Orders   WET PREP FOR TRICH, YEAST, CLUE   Urine Culture   POCT URINALYSIS DIP (CLINITEK) (Completed)      Dysuria-urinalysis positive for leukocytes and trace blood.  Will send for culture for confirmation.  Vaginitis-wet prep also performed will call with results once available.  On exam to look more consistent with a yeast infection so sent Diflucan to the pharmacy today. It sounds like she also has some element of recurrent vaginitis unclear if this is more bacterial or yeast. Consider boric acid suppositories as a potential treatment option in the future.  Meds ordered this encounter  Medications  . fluconazole (DIFLUCAN) 150 MG tablet    Sig: Take 1 tablet (150 mg total) by mouth once for 1 dose.    Dispense:  1 tablet    Refill:  1    Follow-up: Return if symptoms worsen or fail to improve.    Nani Gasser, MD

## 2020-02-04 LAB — URINE CULTURE
MICRO NUMBER:: 11090257
SPECIMEN QUALITY:: ADEQUATE

## 2020-02-04 LAB — WET PREP FOR TRICH, YEAST, CLUE
MICRO NUMBER:: 11090557
Specimen Quality: ADEQUATE

## 2020-04-14 ENCOUNTER — Ambulatory Visit (INDEPENDENT_AMBULATORY_CARE_PROVIDER_SITE_OTHER): Payer: BC Managed Care – PPO

## 2020-04-14 ENCOUNTER — Ambulatory Visit (INDEPENDENT_AMBULATORY_CARE_PROVIDER_SITE_OTHER): Payer: BC Managed Care – PPO | Admitting: Family Medicine

## 2020-04-14 ENCOUNTER — Encounter: Payer: Self-pay | Admitting: Family Medicine

## 2020-04-14 ENCOUNTER — Other Ambulatory Visit: Payer: Self-pay

## 2020-04-14 VITALS — BP 148/85 | HR 87 | Temp 98.9°F | Ht 67.0 in | Wt 214.0 lb

## 2020-04-14 DIAGNOSIS — R202 Paresthesia of skin: Secondary | ICD-10-CM | POA: Diagnosis not present

## 2020-04-14 DIAGNOSIS — M47812 Spondylosis without myelopathy or radiculopathy, cervical region: Secondary | ICD-10-CM | POA: Diagnosis not present

## 2020-04-14 DIAGNOSIS — R200 Anesthesia of skin: Secondary | ICD-10-CM

## 2020-04-14 DIAGNOSIS — F411 Generalized anxiety disorder: Secondary | ICD-10-CM

## 2020-04-14 DIAGNOSIS — R058 Other specified cough: Secondary | ICD-10-CM | POA: Diagnosis not present

## 2020-04-14 MED ORDER — CITALOPRAM HYDROBROMIDE 20 MG PO TABS: ORAL_TABLET | ORAL | 0 refills | Status: DC

## 2020-04-14 NOTE — Progress Notes (Signed)
Established Patient Office Visit  Subjective:  Patient ID: [name], female    DOB: January 16, 1981  Age: 39 y.o. MRN: [id number]  CC:  Chief Complaint  Patient presents with  . Cough    Cough and mid to upper back pain on and off x 3 weeks.  Tingling in arms more so on right when anxiety is high    HPI [name] presents for bilateral tingling of both forearms from the elbow to the wrist but not affecting the hands..  Worse when she feels stressed.  She says it can feel like a stinging numb tingling type pain.  She notices it more when she is more stressed.  She said she had 1 day where she actually noticed a red mark on her forearm it eventually faded and went away by the next day.  She denies any recent chest pain or shortness of breath but does have a dry cough that she has had on and off for about 3 weeks.  But it suddenly got worse yesterday she does feel like she is had a lot of drainage and phlegm in her throat.  No significant sinus congestion no fevers chills or sweats.  She says her son was sick for about 3 weeks with an upper respiratory infection and thought maybe she just got a mild version of that.  She actually thought it was getting a little bit better last week but then yesterday again got worse.  She has been using Sudafed and allergy relief pill which she just started yesterday she also restarted her nasal steroid spray.  She also reports increased stress and anxiety symptoms.  She says she just cannot seem to control it it just comes over her sometimes it is triggered by things and sometimes not.  She is open to seeing a therapist or counselor.   Past Medical History:  Diagnosis Date  . Abnormal pap    cryo  . Anemia   . Chest pain   . H/O anxiety disorder   . Preeclampsia in postpartum period 11/27/2015  . Vaginal Pap smear, abnormal     Past Surgical History:  Procedure Laterality Date  . cervical cryo    . LAPAROSCOPY N/A 03/13/2017   Procedure:  LAPAROSCOPY OPERATIVE;  Surgeon: Adam Phenix, MD;  Location: [facility];  Service: Gynecology;  Laterality: N/A;  . UNILATERAL SALPINGECTOMY Right 03/13/2017   Procedure: UNILATERAL SALPINGECTOMY;  Surgeon: Adam Phenix, MD;  Location: [facility];  Service: Gynecology;  Laterality: Right;  . WISDOM TOOTH EXTRACTION      Family History  Problem Relation Age of Onset  . Hypertension Mother   . Heart failure Mother   . Diabetes Mother   . Hypertension Father   . Diabetes Maternal Aunt   . Breast cancer Maternal Aunt 45  . Hyperlipidemia Other        family history    Social History   Socioeconomic History  . Marital status: Single    Spouse name: Not on file  . Number of children: 3  . Years of education: Not on file  . Highest education level: Not on file  Occupational History  . Occupation: Associate Professor.   Tobacco Use  . Smoking status: Never Smoker  . Smokeless tobacco: Never Used  Substance and Sexual Activity  . Alcohol use: Yes    Alcohol/week: 0.0 standard drinks    Comment: socially  . Drug use: No  . Sexual activity: Yes  Partners: Male    Birth control/protection: None    Comment: single, 3 kids, single mom, no regular exercise  Other Topics Concern  . Not on file  Social History Narrative   1-2 caffeinated drinks per day. No regular exercise.     Social Determinants of Health   Financial Resource Strain: Not on file  Food Insecurity: Not on file  Transportation Needs: Not on file  Physical Activity: Not on file  Stress: Not on file  Social Connections: Not on file  Intimate Partner Violence: Not on file    Outpatient Medications Prior to Visit  Medication Sig Dispense Refill  . Bacillus Coagulans-Inulin (PROBIOTIC) 1-250 BILLION-MG CAPS Take by mouth daily at 6 (six) AM.    . ferrous sulfate 325 (65 FE) MG EC tablet Take 325 mg by mouth daily with breakfast.    . potassium chloride (KLOR-CON) 10 MEQ tablet Take 1 tablet (10 mEq total) by mouth in  the morning and at bedtime. 180 tablet 1   No facility-administered medications prior to visit.    No Known Allergies  ROS Review of Systems    Objective:    Physical Exam Constitutional:      Appearance: She is well-developed.  HENT:     Head: Normocephalic and atraumatic.     Right Ear: Tympanic membrane, ear canal and external ear normal.     Left Ear: Tympanic membrane, ear canal and external ear normal.     Nose: Nose normal.     Mouth/Throat:     Mouth: Mucous membranes are moist.     Pharynx: Oropharynx is clear. No oropharyngeal exudate or posterior oropharyngeal erythema.  Eyes:     Conjunctiva/sclera: Conjunctivae normal.     Pupils: Pupils are equal, round, and reactive to light.  Neck:     Thyroid: No thyromegaly.  Cardiovascular:     Rate and Rhythm: Normal rate and regular rhythm.     Heart sounds: Normal heart sounds.  Pulmonary:     Effort: Pulmonary effort is normal.     Breath sounds: Normal breath sounds. No wheezing.  Musculoskeletal:     Cervical back: Neck supple.  Lymphadenopathy:     Cervical: No cervical adenopathy.  Skin:    General: Skin is warm and dry.  Neurological:     Mental Status: She is alert and oriented to person, place, and time.     BP (!) 148/85   Pulse 87   Temp 98.9 F (37.2 C) (Oral)   Ht 5\' 7"  (1.702 m)   Wt 214 lb (97.1 kg)   LMP 03/28/2020   SpO2 100%   BMI 33.52 kg/m  Wt Readings from Last 3 Encounters:  04/14/20 214 lb (97.1 kg)  02/03/20 212 lb (96.2 kg)  10/07/19 204 lb (92.5 kg)     Health Maintenance Due  Topic Date Due  . Hepatitis C Screening  Never done  . COVID-19 Vaccine (1) Never done  . INFLUENZA VACCINE  11/16/2019    There are no preventive care reminders to display for this patient.  Lab Results  Component Value Date   TSH 1.25 06/23/2019   Lab Results  Component Value Date   WBC 8.9 06/23/2019   HGB 11.3 (L) 06/23/2019   HCT 34.0 (L) 06/23/2019   MCV 78.7 (L) 06/23/2019    PLT 263 06/23/2019   Lab Results  Component Value Date   NA 138 06/23/2019   K 3.9 06/23/2019   CO2 26 06/23/2019  GLUCOSE 82 06/23/2019   BUN 9 06/23/2019   CREATININE 0.57 06/23/2019   BILITOT 0.5 06/23/2019   ALKPHOS 64 03/06/2017   AST 14 06/23/2019   ALT 10 06/23/2019   PROT 6.8 06/23/2019   ALBUMIN 3.6 03/06/2017   CALCIUM 8.9 06/23/2019   ANIONGAP 7 03/06/2017   Lab Results  Component Value Date   CHOL 221 (H) 06/23/2019   Lab Results  Component Value Date   HDL 74 06/23/2019   Lab Results  Component Value Date   LDLCALC 131 (H) 06/23/2019   Lab Results  Component Value Date   TRIG 69 06/23/2019   Lab Results  Component Value Date   CHOLHDL 3.0 06/23/2019   Lab Results  Component Value Date   HGBA1C 5.5 01/26/2016      Assessment & Plan:   Problem List Items Addressed This Visit      Other   ANXIETY DISORDER, GENERALIZED    Gust treatment options including medication and/or therapy/counseling.  She is open to both.  We will go ahead and start citalopram which she had taken previously.  Follow-up in 3 to 4 weeks.  Also place referral for therapy/counseling.      Relevant Medications   citalopram (CELEXA) 20 MG tablet    Other Visit Diagnoses    GAD (generalized anxiety disorder)    -  Primary   Relevant Medications   citalopram (CELEXA) 20 MG tablet   Other Relevant Orders   Ambulatory referral to Behavioral Health   Numbness and tingling in left arm       Relevant Orders   DG Cervical Spine Complete   Numbness and tingling of right arm       Relevant Orders   DG Cervical Spine Complete   Dry cough         Dry cough-said suspect secondary to postnasal drip may have started out as an initial viral illness not sure but I think adding the antihistamine and the nasal steroid spray will be helpful.  If her symptoms worsen over the weekend please let me know the cough is predominantly in the throat area and not in the chest.  Chest exam is  clear today.  Numbness and tingling in both forearms.  Its not in a discrete nerve distribution it does not affect her hands.  Possible cervical pathology but less likely but we will go ahead and get a plain film today I think this is probably more stress-induced we discussed how the body can sometimes present in different ways when someone is feeling overwhelmed or stressed.  Meds ordered this encounter  Medications  . citalopram (CELEXA) 20 MG tablet    Sig: Take 0.5 tablets (10 mg total) by mouth daily for 8 days, THEN 1 tablet (20 mg total) daily for 22 days.    Dispense:  26 tablet    Refill:  0    Follow-up: Return in about 3 weeks (around 05/05/2020) for New start medication.    Nani Gasser, MD

## 2020-04-14 NOTE — Assessment & Plan Note (Signed)
Gust treatment options including medication and/or therapy/counseling.  She is open to both.  We will go ahead and start citalopram which she had taken previously.  Follow-up in 3 to 4 weeks.  Also place referral for therapy/counseling.

## 2020-04-28 ENCOUNTER — Other Ambulatory Visit: Payer: Self-pay | Admitting: Family Medicine

## 2020-04-28 DIAGNOSIS — F411 Generalized anxiety disorder: Secondary | ICD-10-CM

## 2020-05-06 ENCOUNTER — Ambulatory Visit: Payer: BC Managed Care – PPO | Admitting: Family Medicine

## 2020-05-13 ENCOUNTER — Other Ambulatory Visit: Payer: Self-pay

## 2020-05-13 ENCOUNTER — Encounter: Payer: Self-pay | Admitting: Family Medicine

## 2020-05-13 ENCOUNTER — Ambulatory Visit (INDEPENDENT_AMBULATORY_CARE_PROVIDER_SITE_OTHER): Payer: BC Managed Care – PPO | Admitting: Family Medicine

## 2020-05-13 VITALS — BP 123/62 | HR 62 | Ht 67.0 in | Wt 206.0 lb

## 2020-05-13 DIAGNOSIS — R519 Headache, unspecified: Secondary | ICD-10-CM | POA: Diagnosis not present

## 2020-05-13 DIAGNOSIS — R42 Dizziness and giddiness: Secondary | ICD-10-CM | POA: Diagnosis not present

## 2020-05-13 DIAGNOSIS — R030 Elevated blood-pressure reading, without diagnosis of hypertension: Secondary | ICD-10-CM

## 2020-05-13 DIAGNOSIS — F411 Generalized anxiety disorder: Secondary | ICD-10-CM

## 2020-05-13 DIAGNOSIS — H93A3 Pulsatile tinnitus, bilateral: Secondary | ICD-10-CM

## 2020-05-13 NOTE — Assessment & Plan Note (Signed)
Just had her citalopram about a week ago she actually is feeling a little bit better.  Encouraged her to follow-up in about 3 weeks so that we can make sure she is doing well and adjust her dose if needed.  She is currently on a half a tab daily,  please follow taper.

## 2020-05-13 NOTE — Progress Notes (Signed)
Established Patient Office Visit  Subjective:  Patient ID: [name], female    DOB: [birth year]  Age: 40 y.o. MRN: [id number]  CC:  Chief Complaint  Patient presents with  . Hypertension  . Dizziness    HPI [name] presents for  Patient comes in today for episodes of feeling lightheaded.  She says that after I saw her about 3 weeks ago she ended up getting tested for Covid as multiple coworkers had come pause back positive.  She herself tested positive.  She said she never experienced any shortness of breath but is feeling better she still had some runny nose.  But the last week she reports episodes of feeling dizzy and then getting like a pressure across her entire face head and ears.  The pressure is the same on both sides of the head she says it feels different than when she is had a bad sinus infection in the past.  She says usually when she could sit down and try to relax the pressure would actually ease off.  She is also noticed some ear "whooshing" as well it also seems to get relief when she changes position and seems to be temporary.  Today she did notice a little bit of light sensitivity and her eyes.  Follow-up anxiety-she felt like while having COVID her anxiety was really ramped up.  Was having really fearful thoughts.  She actually held off on starting the citalopram at that time because she was sick but did start it about a week ago on Friday.  She actually has felt a little better since starting the medication.  Past Medical History:  Diagnosis Date  . Abnormal pap    cryo  . Anemia   . Chest pain   . H/O anxiety disorder   . Preeclampsia in postpartum period 11/27/2015  . Vaginal Pap smear, abnormal     Past Surgical History:  Procedure Laterality Date  . cervical cryo    . LAPAROSCOPY N/A 03/13/2017   Procedure: LAPAROSCOPY OPERATIVE;  Surgeon: Adam Phenix, MD;  Location: [facility];  Service: Gynecology;  Laterality: N/A;  . UNILATERAL  SALPINGECTOMY Right 03/13/2017   Procedure: UNILATERAL SALPINGECTOMY;  Surgeon: Adam Phenix, MD;  Location: [facility];  Service: Gynecology;  Laterality: Right;  . WISDOM TOOTH EXTRACTION      Family History  Problem Relation Age of Onset  . Hypertension Mother   . Heart failure Mother   . Diabetes Mother   . Hypertension Father   . Diabetes Maternal Aunt   . Breast cancer Maternal Aunt 45  . Hyperlipidemia Other        family history    Social History   Socioeconomic History  . Marital status: Single    Spouse name: Not on file  . Number of children: 3  . Years of education: Not on file  . Highest education level: Not on file  Occupational History  . Occupation: Associate Professor.   Tobacco Use  . Smoking status: Never Smoker  . Smokeless tobacco: Never Used  Substance and Sexual Activity  . Alcohol use: Yes    Alcohol/week: 0.0 standard drinks    Comment: socially  . Drug use: No  . Sexual activity: Yes    Partners: Male    Birth control/protection: None    Comment: single, 3 kids, single mom, no regular exercise  Other Topics Concern  . Not on file  Social History Narrative   1-2 caffeinated drinks  per day. No regular exercise.     Social Determinants of Health   Financial Resource Strain: Not on file  Food Insecurity: Not on file  Transportation Needs: Not on file  Physical Activity: Not on file  Stress: Not on file  Social Connections: Not on file  Intimate Partner Violence: Not on file    Outpatient Medications Prior to Visit  Medication Sig Dispense Refill  . Bacillus Coagulans-Inulin (PROBIOTIC) 1-250 BILLION-MG CAPS Take by mouth daily at 6 (six) AM.    . citalopram (CELEXA) 20 MG tablet Take 0.5 tablets (10 mg total) by mouth daily for 8 days, THEN 1 tablet (20 mg total) daily for 22 days. 26 tablet 0  . ferrous sulfate 325 (65 FE) MG EC tablet Take 325 mg by mouth daily with breakfast.    . potassium chloride (KLOR-CON) 10 MEQ tablet Take 1 tablet  (10 mEq total) by mouth in the morning and at bedtime. 180 tablet 1   No facility-administered medications prior to visit.    No Known Allergies  ROS Review of Systems    Objective:    Physical Exam Constitutional:      Appearance: She is well-developed.  HENT:     Head: Normocephalic and atraumatic.     Right Ear: Tympanic membrane, ear canal and external ear normal.     Left Ear: Tympanic membrane, ear canal and external ear normal.     Nose: Nose normal.     Mouth/Throat:     Mouth: Mucous membranes are moist.     Pharynx: Oropharynx is clear. No oropharyngeal exudate or posterior oropharyngeal erythema.  Eyes:     Conjunctiva/sclera: Conjunctivae normal.     Pupils: Pupils are equal, round, and reactive to light.  Neck:     Thyroid: No thyromegaly.     Comments: No carotid bruits. Cardiovascular:     Rate and Rhythm: Normal rate and regular rhythm.     Heart sounds: Normal heart sounds.  Pulmonary:     Effort: Pulmonary effort is normal.     Breath sounds: Normal breath sounds. No wheezing.  Musculoskeletal:     Cervical back: Neck supple.  Lymphadenopathy:     Cervical: No cervical adenopathy.  Skin:    General: Skin is warm and dry.  Neurological:     Mental Status: She is alert and oriented to person, place, and time.     BP 123/62   Pulse 62   Ht 5\' 7"  (1.702 m)   Wt 206 lb (93.4 kg)   SpO2 100%   BMI 32.26 kg/m  Wt Readings from Last 3 Encounters:  05/13/20 206 lb (93.4 kg)  04/14/20 214 lb (97.1 kg)  02/03/20 212 lb (96.2 kg)     Health Maintenance Due  Topic Date Due  . Hepatitis C Screening  Never done  . COVID-19 Vaccine (1) Never done    There are no preventive care reminders to display for this patient.  Lab Results  Component Value Date   TSH 1.25 06/23/2019   Lab Results  Component Value Date   WBC 8.9 06/23/2019   HGB 11.3 (L) 06/23/2019   HCT 34.0 (L) 06/23/2019   MCV 78.7 (L) 06/23/2019   PLT 263 06/23/2019   Lab  Results  Component Value Date   NA 138 06/23/2019   K 3.9 06/23/2019   CO2 26 06/23/2019   GLUCOSE 82 06/23/2019   BUN 9 06/23/2019   CREATININE 0.57 06/23/2019   BILITOT 0.5 06/23/2019  ALKPHOS 64 03/06/2017   AST 14 06/23/2019   ALT 10 06/23/2019   PROT 6.8 06/23/2019   ALBUMIN 3.6 03/06/2017   CALCIUM 8.9 06/23/2019   ANIONGAP 7 03/06/2017   Lab Results  Component Value Date   CHOL 221 (H) 06/23/2019   Lab Results  Component Value Date   HDL 74 06/23/2019   Lab Results  Component Value Date   LDLCALC 131 (H) 06/23/2019   Lab Results  Component Value Date   TRIG 69 06/23/2019   Lab Results  Component Value Date   CHOLHDL 3.0 06/23/2019   Lab Results  Component Value Date   HGBA1C 5.5 01/26/2016      Assessment & Plan:   Problem List Items Addressed This Visit      Other   ANXIETY DISORDER, GENERALIZED    Just had her citalopram about a week ago she actually is feeling a little bit better.  Encouraged her to follow-up in about 3 weeks so that we can make sure she is doing well and adjust her dose if needed.  She is currently on a half a tab daily,  please follow taper.       Other Visit Diagnoses    GAD (generalized anxiety disorder)    -  Primary   Elevated BP without diagnosis of hypertension       Lightheaded       Pressure in head       Pulsatile tinnitus of both ears          Head pressure-unclear etiology.  She says it does not feel quite like a sinus infection at all over her head.  Certainly could be related to her blood pressure going up though her blood pressure look great here today and even during orthostatics it was normal.  And it did recreate some of her symptoms with position change.  So also consider underlying sinusitis.  We did discuss that if she starts getting worsening nasal congestion to please let me know and we will consider a trial of antibiotics.  We have seen some cases of people getting of her Covid and then developing a  secondary bacterial sinus infection.  Lightheadedness-again unclear etiology at this point could certainly be blood pressure related though she is not orthostatic today but she is also feeling better today in fact her blood pressure looks beautiful.  Pulsatile tinnitus of both ears-less likely to be an aneurysm because it is bilateral.  Could be related to her anxiety.  No carotid bruits on exam and heart sounds great.  No orders of the defined types were placed in this encounter.   Follow-up: Return in about 3 weeks (around 06/03/2020) for New start medication.    Nani Gasser, MD

## 2020-06-03 ENCOUNTER — Other Ambulatory Visit: Payer: Self-pay

## 2020-06-03 ENCOUNTER — Ambulatory Visit (INDEPENDENT_AMBULATORY_CARE_PROVIDER_SITE_OTHER): Payer: BC Managed Care – PPO | Admitting: Family Medicine

## 2020-06-03 ENCOUNTER — Encounter: Payer: Self-pay | Admitting: Family Medicine

## 2020-06-03 VITALS — BP 134/68 | HR 67 | Ht 67.0 in | Wt 211.0 lb

## 2020-06-03 DIAGNOSIS — G4700 Insomnia, unspecified: Secondary | ICD-10-CM | POA: Diagnosis not present

## 2020-06-03 DIAGNOSIS — F411 Generalized anxiety disorder: Secondary | ICD-10-CM

## 2020-06-03 MED ORDER — CITALOPRAM HYDROBROMIDE 20 MG PO TABS
20.0000 mg | ORAL_TABLET | Freq: Every day | ORAL | 1 refills | Status: DC
Start: 2020-06-03 — End: 2022-01-12

## 2020-06-03 MED ORDER — TRAZODONE HCL 50 MG PO TABS: 25.0000 mg | ORAL_TABLET | Freq: Every evening | ORAL | 1 refills | Status: DC | PRN

## 2020-06-03 NOTE — Progress Notes (Signed)
Acute Office Visit  Subjective:    Patient ID: [name], female    DOB: 06-Jun-1980, 40 y.o.   MRN: [id number]  Chief Complaint  Patient presents with  . Anxiety    HPI Patient is in today for for follow-up anxiety and insomnia.  Overall she is actually doing significantly better from when I last saw her.  She is now on citalopram she says she is tolerating the medication without any significant side effects or problems she says she still feels like herself in fact she feels like she did about 10 years ago.  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  Struggles feeling a little anxious at times and difficulty controlling her worry but does not feel like it is half the time or more.  She reports she still struggling with staying asleep at night she can usually fall asleep but then after about 4 hours wakes up and then cannot go back to sleep.  She did try melatonin and it was not helpful.  Past Medical History:  Diagnosis Date  . Abnormal pap    cryo  . Anemia   . Chest pain   . H/O anxiety disorder   . Preeclampsia in postpartum period 11/27/2015  . Vaginal Pap smear, abnormal     Past Surgical History:  Procedure Laterality Date  . cervical cryo    . LAPAROSCOPY N/A 03/13/2017   Procedure: LAPAROSCOPY OPERATIVE;  Surgeon: Adam Phenix, MD;  Location: [facility];  Service: Gynecology;  Laterality: N/A;  . UNILATERAL SALPINGECTOMY Right 03/13/2017   Procedure: UNILATERAL SALPINGECTOMY;  Surgeon: Adam Phenix, MD;  Location: [facility];  Service: Gynecology;  Laterality: Right;  . WISDOM TOOTH EXTRACTION      Family History  Problem Relation Age of Onset  . Hypertension Mother   . Heart failure Mother   . Diabetes Mother   . Hypertension Father   . Diabetes Maternal Aunt   . Breast cancer Maternal Aunt 45  . Hyperlipidemia Other        family history    Social History   Socioeconomic History  . Marital  status: Single    Spouse name: Not on file  . Number of children: 3  . Years of education: Not on file  . Highest education level: Not on file  Occupational History  . Occupation: Associate Professor.   Tobacco Use  . Smoking status: Never Smoker  . Smokeless tobacco: Never Used  Substance and Sexual Activity  . Alcohol use: Yes    Alcohol/week: 0.0 standard drinks    Comment: socially  . Drug use: No  . Sexual activity: Yes    Partners: Male    Birth control/protection: None    Comment: single, 3 kids, single mom, no regular exercise  Other Topics Concern  . Not on file  Social History Narrative   1-2 caffeinated drinks per day. No regular exercise.     Social Determinants of Health   Financial Resource Strain: Not on file  Food Insecurity: Not on file  Transportation Needs: Not on file  Physical Activity: Not on file  Stress: Not on file  Social Connections: Not on file  Intimate Partner Violence: Not on file    Outpatient Medications Prior to Visit  Medication Sig Dispense Refill  . Bacillus Coagulans-Inulin (PROBIOTIC) 1-250 BILLION-MG CAPS Take by mouth daily at 6 (six) AM.    .  ferrous sulfate 325 (65 FE) MG EC tablet Take 325 mg by mouth daily with breakfast.    . potassium chloride (KLOR-CON) 10 MEQ tablet Take 1 tablet (10 mEq total) by mouth in the morning and at bedtime. 180 tablet 1  . citalopram (CELEXA) 20 MG tablet Take 0.5 tablets (10 mg total) by mouth daily for 8 days, THEN 1 tablet (20 mg total) daily for 22 days. 26 tablet 0   No facility-administered medications prior to visit.    No Known Allergies  Review of Systems     Objective:    Physical Exam Constitutional:      Appearance: She is well-developed and well-nourished.  HENT:     Head: Normocephalic and atraumatic.  Cardiovascular:     Rate and Rhythm: Normal rate and regular rhythm.     Heart sounds: Normal heart sounds.  Pulmonary:     Effort: Pulmonary effort is normal.     Breath  sounds: Normal breath sounds.  Skin:    General: Skin is warm and dry.  Neurological:     Mental Status: She is alert and oriented to person, place, and time.  Psychiatric:        Mood and Affect: Mood and affect normal.        Behavior: Behavior normal.     BP 134/68   Pulse 67   Ht 5\' 7"  (1.702 m)   Wt 211 lb (95.7 kg)   SpO2 100%   BMI 33.05 kg/m  Wt Readings from Last 3 Encounters:  06/03/20 211 lb (95.7 kg)  05/13/20 206 lb (93.4 kg)  04/14/20 214 lb (97.1 kg)    Health Maintenance Due  Topic Date Due  . Hepatitis C Screening  Never done  . COVID-19 Vaccine (1) Never done    There are no preventive care reminders to display for this patient.   Lab Results  Component Value Date   TSH 1.25 06/23/2019   Lab Results  Component Value Date   WBC 8.9 06/23/2019   HGB 11.3 (L) 06/23/2019   HCT 34.0 (L) 06/23/2019   MCV 78.7 (L) 06/23/2019   PLT 263 06/23/2019   Lab Results  Component Value Date   NA 138 06/23/2019   K 3.9 06/23/2019   CO2 26 06/23/2019   GLUCOSE 82 06/23/2019   BUN 9 06/23/2019   CREATININE 0.57 06/23/2019   BILITOT 0.5 06/23/2019   ALKPHOS 64 03/06/2017   AST 14 06/23/2019   ALT 10 06/23/2019   PROT 6.8 06/23/2019   ALBUMIN 3.6 03/06/2017   CALCIUM 8.9 06/23/2019   ANIONGAP 7 03/06/2017   Lab Results  Component Value Date   CHOL 221 (H) 06/23/2019   Lab Results  Component Value Date   HDL 74 06/23/2019   Lab Results  Component Value Date   LDLCALC 131 (H) 06/23/2019   Lab Results  Component Value Date   TRIG 69 06/23/2019   Lab Results  Component Value Date   CHOLHDL 3.0 06/23/2019   Lab Results  Component Value Date   HGBA1C 5.5 01/26/2016       Assessment & Plan:   Problem List Items Addressed This Visit      Other   INSOMNIA - Primary    We discussed different options since the mood medication has helped greatly reduce her anxiety but the sleep quality still has not changed significantly.  We can start  with a low-dose of trazodone did warn about potential for side effects including  sedation.  Can start with a half a tab and then go up to 1 or even 1-1/2 if needed.  We will try that she can let me know how it is working and she can also just use it as needed if she would like      Relevant Medications   traZODone (DESYREL) 50 MG tablet   ANXIETY DISORDER, GENERALIZED    Great improvement on citalopram.  PHQ-9 score is currently 3, down from 11.  GAD-7 score 5 today, down from previous of 17.  Significant improvement she rates her symptoms as not difficult and again no significant side effects we will continue with medication.  Plan to follow-up in 3 to 4 months.  Refill sent to pharmacy.      Relevant Medications   citalopram (CELEXA) 20 MG tablet   traZODone (DESYREL) 50 MG tablet    Other Visit Diagnoses    GAD (generalized anxiety disorder)       Relevant Medications   citalopram (CELEXA) 20 MG tablet   traZODone (DESYREL) 50 MG tablet       Meds ordered this encounter  Medications  . citalopram (CELEXA) 20 MG tablet    Sig: Take 1 tablet (20 mg total) by mouth daily.    Dispense:  90 tablet    Refill:  1  . traZODone (DESYREL) 50 MG tablet    Sig: Take 0.5-1.5 tablets (25-75 mg total) by mouth at bedtime as needed for sleep.    Dispense:  45 tablet    Refill:  1     Nani Gasser, MD

## 2020-06-03 NOTE — Assessment & Plan Note (Signed)
We discussed different options since the mood medication has helped greatly reduce her anxiety but the sleep quality still has not changed significantly.  We can start with a low-dose of trazodone did warn about potential for side effects including sedation.  Can start with a half a tab and then go up to 1 or even 1-1/2 if needed.  We will try that she can let me know how it is working and she can also just use it as needed if she would like

## 2020-06-03 NOTE — Assessment & Plan Note (Signed)
Great improvement on citalopram.  PHQ-9 score is currently 3, down from 11.  GAD-7 score 5 today, down from previous of 17.  Significant improvement she rates her symptoms as not difficult and again no significant side effects we will continue with medication.  Plan to follow-up in 3 to 4 months.  Refill sent to pharmacy.

## 2020-06-28 ENCOUNTER — Ambulatory Visit (INDEPENDENT_AMBULATORY_CARE_PROVIDER_SITE_OTHER): Payer: BC Managed Care – PPO | Admitting: Family Medicine

## 2020-06-28 ENCOUNTER — Encounter: Payer: Self-pay | Admitting: Family Medicine

## 2020-06-28 ENCOUNTER — Other Ambulatory Visit: Payer: Self-pay

## 2020-06-28 VITALS — BP 120/75 | HR 50 | Ht 67.0 in | Wt 207.0 lb

## 2020-06-28 DIAGNOSIS — Z113 Encounter for screening for infections with a predominantly sexual mode of transmission: Secondary | ICD-10-CM

## 2020-06-28 DIAGNOSIS — Z0000 Encounter for general adult medical examination without abnormal findings: Secondary | ICD-10-CM | POA: Diagnosis not present

## 2020-06-28 DIAGNOSIS — Z1159 Encounter for screening for other viral diseases: Secondary | ICD-10-CM

## 2020-06-28 NOTE — Progress Notes (Signed)
Subjective:     [name] is a 40 y.o. female and is here for a comprehensive physical exam. The patient reports no problems. She would like STD testing. She has been exercising regularly. She has been eating more healthy.    Neg ROS.   Social History   Socioeconomic History  . Marital status: Single    Spouse name: Not on file  . Number of children: 3  . Years of education: Not on file  . Highest education level: Not on file  Occupational History  . Occupation: Associate Professor.   Tobacco Use  . Smoking status: Never Smoker  . Smokeless tobacco: Never Used  Substance and Sexual Activity  . Alcohol use: Yes    Alcohol/week: 0.0 standard drinks    Comment: socially  . Drug use: No  . Sexual activity: Yes    Partners: Male    Birth control/protection: None    Comment: single, 3 kids, single mom, no regular exercise  Other Topics Concern  . Not on file  Social History Narrative   1-2 caffeinated drinks per day. No regular exercise.     Social Determinants of Health   Financial Resource Strain: Not on file  Food Insecurity: Not on file  Transportation Needs: Not on file  Physical Activity: Not on file  Stress: Not on file  Social Connections: Not on file  Intimate Partner Violence: Not on file   Health Maintenance  Topic Date Due  . Hepatitis C Screening  Never done  . INFLUENZA VACCINE  07/15/2020 (Originally 11/16/2019)  . COVID-19 Vaccine (1) 06/28/2021 (Originally 08/23/1986)  . PAP SMEAR-Modifier  06/22/2024  . TETANUS/TDAP  09/15/2025  . HIV Screening  Completed  . HPV VACCINES  Aged Out    The following portions of the patient's history were reviewed and updated as appropriate: allergies, current medications, past family history, past medical history, past social history, past surgical history and problem list.  Review of Systems A comprehensive review of systems was negative.   Objective:    BP 120/75   Pulse (!) 50   Ht 5\' 7"  (1.702 m)   Wt 207 lb  (93.9 kg)   SpO2 100%   BMI 32.42 kg/m  General appearance: alert, cooperative and appears stated age Head: Normocephalic, without obvious abnormality, atraumatic Eyes: conj claer, EOMI, PEERLA Ears: normal TM's and external ear canals both ears Nose: Nares normal. Septum midline. Mucosa normal. No drainage or sinus tenderness. Throat: lips, mucosa, and tongue normal; teeth and gums normal Neck: no adenopathy, no carotid bruit, no JVD, supple, symmetrical, trachea midline and thyroid not enlarged, symmetric, no tenderness/mass/nodules Back: symmetric, no curvature. ROM normal. No CVA tenderness. Lungs: clear to auscultation bilaterally Breasts: normal appearance, no masses or tenderness Heart: regular rate and rhythm, S1, S2 normal, no murmur, click, rub or gallop Abdomen: soft, non-tender; bowel sounds normal; no masses,  no organomegaly Extremities: extremities normal, atraumatic, no cyanosis or edema Pulses: 2+ and symmetric Skin: Skin color, texture, turgor normal. No rashes or lesions Lymph nodes: Cervical, supraclavicular, and axillary nodes normal. Neurologic: Alert and oriented X 3, normal strength and tone. Normal symmetric reflexes. Normal coordination and gait    Assessment:    Healthy female exam.      Plan:     See After Visit Summary for Counseling Recommendations   Keep up a regular exercise program and make sure you are eating a healthy diet Try to eat 4 servings of dairy a day, or if  you are lactose intolerant take a calcium with vitamin D daily.  Your vaccines are up to date.

## 2020-06-28 NOTE — Patient Instructions (Signed)
Preventive Care 21-39 Years Old, Female Preventive care refers to lifestyle choices and visits with your health care provider that can promote health and wellness. This includes:  A yearly physical exam. This is also called an annual wellness visit.  Regular dental and eye exams.  Immunizations.  Screening for certain conditions.  Healthy lifestyle choices, such as: ? Eating a healthy diet. ? Getting regular exercise. ? Not using drugs or products that contain nicotine and tobacco. ? Limiting alcohol use. What can I expect for my preventive care visit? Physical exam Your health care provider may check your:  Height and weight. These may be used to calculate your BMI (body mass index). BMI is a measurement that tells if you are at a healthy weight.  Heart rate and blood pressure.  Body temperature.  Skin for abnormal spots. Counseling Your health care provider may ask you questions about your:  Past medical problems.  Family's medical history.  Alcohol, tobacco, and drug use.  Emotional well-being.  Home life and relationship well-being.  Sexual activity.  Diet, exercise, and sleep habits.  Work and work environment.  Access to firearms.  Method of birth control.  Menstrual cycle.  Pregnancy history. What immunizations do I need? Vaccines are usually given at various ages, according to a schedule. Your health care provider will recommend vaccines for you based on your age, medical history, and lifestyle or other factors, such as travel or where you work.   What tests do I need? Blood tests  Lipid and cholesterol levels. These may be checked every 5 years starting at age 20.  Hepatitis C test.  Hepatitis B test. Screening  Diabetes screening. This is done by checking your blood sugar (glucose) after you have not eaten for a while (fasting).  STD (sexually transmitted disease) testing, if you are at risk.  BRCA-related cancer screening. This may be  done if you have a family history of breast, ovarian, tubal, or peritoneal cancers.  Pelvic exam and Pap test. This may be done every 3 years starting at age 21. Starting at age 30, this may be done every 5 years if you have a Pap test in combination with an HPV test. Talk with your health care provider about your test results, treatment options, and if necessary, the need for more tests.   Follow these instructions at home: Eating and drinking  Eat a healthy diet that includes fresh fruits and vegetables, whole grains, lean protein, and low-fat dairy products.  Take vitamin and mineral supplements as recommended by your health care provider.  Do not drink alcohol if: ? Your health care provider tells you not to drink. ? You are pregnant, may be pregnant, or are planning to become pregnant.  If you drink alcohol: ? Limit how much you have to 0-1 drink a day. ? Be aware of how much alcohol is in your drink. In the [place] one drink equals one 12 oz bottle of beer (355 mL), one 5 oz glass of wine (148 mL), or one 1 oz glass of hard liquor (44 mL).   Lifestyle  Take daily care of your teeth and gums. Brush your teeth every morning and night with fluoride toothpaste. Floss one time each day.  Stay active. Exercise for at least 30 minutes 5 or more days each week.  Do not use any products that contain nicotine or tobacco, such as cigarettes, e-cigarettes, and chewing tobacco. If you need help quitting, ask your health care provider.  Do not   use drugs.  If you are sexually active, practice safe sex. Use a condom or other form of protection to prevent STIs (sexually transmitted infections).  If you do not wish to become pregnant, use a form of birth control. If you plan to become pregnant, see your health care provider for a prepregnancy visit.  Find healthy ways to cope with stress, such as: ? Meditation, yoga, or listening to music. ? Journaling. ? Talking to a trusted  person. ? Spending time with friends and family. Safety  Always wear your seat belt while driving or riding in a vehicle.  Do not drive: ? If you have been drinking alcohol. Do not ride with someone who has been drinking. ? When you are tired or distracted. ? While texting.  Wear a helmet and other protective equipment during sports activities.  If you have firearms in your house, make sure you follow all gun safety procedures.  Seek help if you have been physically or sexually abused. What's next?  Go to your health care provider once a year for an annual wellness visit.  Ask your health care provider how often you should have your eyes and teeth checked.  Stay up to date on all vaccines. This information is not intended to replace advice given to you by your health care provider. Make sure you discuss any questions you have with your health care provider. Document Revised: 11/30/2019 Document Reviewed: 12/13/2017 Elsevier Patient Education  2021 Elsevier Inc.  

## 2020-06-29 LAB — CBC
HCT: 35.6 % (ref 35.0–45.0)
Hemoglobin: 11.5 g/dL — ABNORMAL LOW (ref 11.7–15.5)
MCH: 25.7 pg — ABNORMAL LOW (ref 27.0–33.0)
MCHC: 32.3 g/dL (ref 32.0–36.0)
MCV: 79.6 fL — ABNORMAL LOW (ref 80.0–100.0)
MPV: 11.5 fL (ref 7.5–12.5)
Platelets: 269 10*3/uL (ref 140–400)
RBC: 4.47 10*6/uL (ref 3.80–5.10)
RDW: 13.6 % (ref 11.0–15.0)
WBC: 7.3 10*3/uL (ref 3.8–10.8)

## 2020-06-29 LAB — TSH: TSH: 1.21 mIU/L

## 2020-06-29 LAB — COMPLETE METABOLIC PANEL WITH GFR
AG Ratio: 1.6 (calc) (ref 1.0–2.5)
ALT: 15 U/L (ref 6–29)
AST: 14 U/L (ref 10–30)
Albumin: 4.4 g/dL (ref 3.6–5.1)
Alkaline phosphatase (APISO): 66 U/L (ref 31–125)
BUN: 12 mg/dL (ref 7–25)
CO2: 26 mmol/L (ref 20–32)
Calcium: 9.1 mg/dL (ref 8.6–10.2)
Chloride: 107 mmol/L (ref 98–110)
Creat: 0.63 mg/dL (ref 0.50–1.10)
GFR, Est African American: 132 mL/min/{1.73_m2} (ref >=60)
GFR, Est Non African American: 114 mL/min/{1.73_m2} (ref >=60)
Globulin: 2.7 g/dL (calc) (ref 1.9–3.7)
Glucose, Bld: 94 mg/dL (ref 65–99)
Potassium: 4.3 mmol/L (ref 3.5–5.3)
Sodium: 140 mmol/L (ref 135–146)
Total Bilirubin: 0.4 mg/dL (ref 0.2–1.2)
Total Protein: 7.1 g/dL (ref 6.1–8.1)

## 2020-06-29 LAB — RPR: RPR Ser Ql: NONREACTIVE

## 2020-06-29 LAB — LIPID PANEL W/REFLEX DIRECT LDL
Cholesterol: 209 mg/dL — ABNORMAL HIGH (ref <200)
HDL: 63 mg/dL (ref >=50)
LDL Cholesterol (Calc): 130 mg/dL (calc) — ABNORMAL HIGH
Non-HDL Cholesterol (Calc): 146 mg/dL (calc) — ABNORMAL HIGH (ref <130)
Total CHOL/HDL Ratio: 3.3 (calc) (ref <5.0)
Triglycerides: 72 mg/dL (ref <150)

## 2020-06-29 LAB — C. TRACHOMATIS/N. GONORRHOEAE RNA
C. trachomatis RNA, TMA: NOT DETECTED
C. trachomatis RNA, TMA: NOT DETECTED
N. gonorrhoeae RNA, TMA: NOT DETECTED
N. gonorrhoeae RNA, TMA: NOT DETECTED

## 2020-06-29 LAB — HEPATITIS C ANTIBODY
Hepatitis C Ab: NONREACTIVE
SIGNAL TO CUT-OFF: 0.01 (ref <1.00)

## 2020-06-29 LAB — HIV ANTIBODY (ROUTINE TESTING W REFLEX): HIV 1&2 Ab, 4th Generation: NONREACTIVE

## 2020-07-01 ENCOUNTER — Other Ambulatory Visit: Payer: Self-pay | Admitting: Family Medicine

## 2020-07-01 DIAGNOSIS — G4700 Insomnia, unspecified: Secondary | ICD-10-CM

## 2020-08-31 ENCOUNTER — Ambulatory Visit: Payer: BC Managed Care – PPO | Admitting: Family Medicine

## 2021-05-09 ENCOUNTER — Other Ambulatory Visit: Payer: Self-pay | Admitting: Family Medicine

## 2021-05-20 ENCOUNTER — Ambulatory Visit (INDEPENDENT_AMBULATORY_CARE_PROVIDER_SITE_OTHER): Payer: Self-pay | Admitting: Medical-Surgical

## 2021-05-20 ENCOUNTER — Other Ambulatory Visit: Payer: Self-pay

## 2021-05-20 ENCOUNTER — Encounter: Payer: Self-pay | Admitting: Medical-Surgical

## 2021-05-20 VITALS — BP 128/84 | HR 64 | Resp 20 | Ht 67.0 in | Wt 214.0 lb

## 2021-05-20 DIAGNOSIS — R6889 Other general symptoms and signs: Secondary | ICD-10-CM | POA: Diagnosis not present

## 2021-05-20 LAB — POCT INFLUENZA A/B
Influenza A, POC: NEGATIVE
Influenza B, POC: NEGATIVE

## 2021-05-20 MED ORDER — AZELASTINE HCL 0.1 % NA SOLN: 2.0000 | Freq: Two times a day (BID) | NASAL | 1 refills | Status: DC

## 2021-05-20 MED ORDER — BENZONATATE 200 MG PO CAPS: 200.0000 mg | ORAL_CAPSULE | Freq: Three times a day (TID) | ORAL | 0 refills | Status: DC | PRN

## 2021-05-20 NOTE — Patient Instructions (Signed)
Medications & Home Remedies for Upper Respiratory Illness   Note: the following list assumes no pregnancy, normal liver & kidney function and no other drug interactions. I have highlighted medications which are safe for you to use, but these may not be appropriate for everyone. Always ask a pharmacist or qualified medical provider if you have any questions!    Aches/Pains, Fever, Headache OTC Acetaminophen (Tylenol) 500 mg tablets - take max 2 tablets (1000 mg) every 6 hours (4 times per day)  OTC Ibuprofen (Motrin) 200 mg tablets - take max 4 tablets (800 mg) every 6 hours*   Sinus Congestion Prescription Atrovent as directed OTC Nasal Saline if desired to rinse OTC Oxymetolazone (Afrin, others) sparing use due to rebound congestion, NEVER use in kids OTC Phenylephrine (Sudafed) 10 mg tablets every 4 hours (or the 12-hour formulation)* OTC Diphenhydramine (Benadryl) 25 mg tablets - take max 2 tablets every 4 hours   Cough & Sore Throat Prescription cough pills or syrups as directed OTC Dextromethorphan (Robitussin, others) - cough suppressant OTC Guaifenesin (Robitussin, Mucinex, others) - expectorant (helps cough up mucus) (Dextromethorphan and Guaifenesin also come in a combination tablet/syrup) OTC Lozenges w/ Benzocaine + Menthol (Cepacol) Honey - as much as you want! Teas which "coat the throat" - look for ingredients Elm Bark, Licorice Root, Marshmallow Root   Other Prescription Oral Steroids to decrease inflammation and improve energy Prescription Antibiotics if these are necessary for bacterial infection - take ALL, even if you're feeling better  OTC Zinc Lozenges within 24 hours of symptoms onset - mixed evidence this shortens the duration of the common cold Don't waste your money on Vitamin C or Echinacea in acute illness - it's already too late!    *Caution in patients with high blood pressure

## 2021-05-20 NOTE — Progress Notes (Signed)
°  HPI with pertinent ROS:   CC: Viral symptoms  HPI: Pleasant 41 year old female presenting today for evaluation of viral symptoms that started on Monday.  She originally woke up with a sore throat but thought this may have been related to something else including allergies.  She worked from home on Tuesday and notes that she developed bilateral ear pressure and achiness.  She has since developed postnasal drip, nasal congestion, headache, and chills.  She did test for COVID on Tuesday with negative results.  Has been taking zinc as well as her allergy medication.  Has tried an over-the-counter mucus relief along with ibuprofen.  Did do a steam shower which resulted in increased sneezing and was able to get some of her sinus congestion out.  She has been waking at night feeling stuffy and having some shortness of breath due to difficulty breathing through her nose.  She has also used saline sprays and rinses without relief.  I reviewed the past medical history, family history, social history, surgical history, and allergies today and no changes were needed.  Please see the problem list section below in epic for further details.   Physical exam:   General: Well Developed, well nourished, and in no acute distress.  Neuro: Alert and oriented x3.  HEENT: Normocephalic, atraumatic.  Bilateral external ear canals patent, TMs with middle ear effusion bilaterally. Skin: Warm and dry. Cardiac: Regular rate and rhythm, no murmurs rubs or gallops, no lower extremity edema.  Respiratory: Clear to auscultation bilaterally. Not using accessory muscles, speaking in full sentences.  Impression and Recommendations:    1. Flu-like symptoms POCT influenza negative.  COVID swab sent to lab.  Discussed symptomatic treatment and recommendations.  Sending in azelastine nasal spray and recommend using this along with Flonase.  Discussed appropriate administration technique for Flonase nasal spray for the optimal  therapy.  Information provided on AVS for upper respiratory infection over-the-counter medications. - POCT Influenza A/B - Novel Coronavirus, NAA ([facility])  Return if symptoms worsen or fail to improve. ___________________________________________ Thayer Ohm, DNP, APRN, FNP-BC Primary Care and Sports Medicine [facility] [place]

## 2021-05-21 LAB — SARS-COV-2, NAA 2 DAY TAT

## 2021-05-21 LAB — NOVEL CORONAVIRUS, NAA: SARS-CoV-2, NAA: NOT DETECTED

## 2021-06-11 ENCOUNTER — Other Ambulatory Visit: Payer: Self-pay | Admitting: Family Medicine

## 2021-09-04 ENCOUNTER — Other Ambulatory Visit: Payer: Self-pay | Admitting: Family Medicine

## 2022-01-12 ENCOUNTER — Ambulatory Visit (INDEPENDENT_AMBULATORY_CARE_PROVIDER_SITE_OTHER): Payer: 59 | Admitting: Family Medicine

## 2022-01-12 ENCOUNTER — Encounter: Payer: Self-pay | Admitting: Family Medicine

## 2022-01-12 VITALS — BP 136/74 | HR 61 | Ht 67.0 in | Wt 206.0 lb

## 2022-01-12 DIAGNOSIS — F411 Generalized anxiety disorder: Secondary | ICD-10-CM | POA: Diagnosis not present

## 2022-01-12 DIAGNOSIS — G4700 Insomnia, unspecified: Secondary | ICD-10-CM | POA: Diagnosis not present

## 2022-01-12 DIAGNOSIS — R69 Illness, unspecified: Secondary | ICD-10-CM | POA: Diagnosis not present

## 2022-01-12 DIAGNOSIS — Z0000 Encounter for general adult medical examination without abnormal findings: Secondary | ICD-10-CM

## 2022-01-12 DIAGNOSIS — Z1239 Encounter for other screening for malignant neoplasm of breast: Secondary | ICD-10-CM

## 2022-01-12 MED ORDER — CITALOPRAM HYDROBROMIDE 20 MG PO TABS: 20.0000 mg | ORAL_TABLET | Freq: Every day | ORAL | 3 refills | Status: DC

## 2022-01-12 MED ORDER — TRAZODONE HCL 50 MG PO TABS: 25.0000 mg | ORAL_TABLET | Freq: Every evening | ORAL | 1 refills | Status: DC | PRN

## 2022-01-12 NOTE — Progress Notes (Signed)
Complete physical exam  Patient: [name]   DOB: [birth year]   41 y.o. Female  MRN: [id number]  Subjective:    Chief Complaint  Patient presents with   Annual Exam    [name] is a 41 y.o. female who presents today for a complete physical exam. She reports consuming a general diet. The patient does not participate in regular exercise at present. She generally feels well.  well. She does have additional problems to discuss today.  She is frustrated with her weight.  She tried a couple different diets and cannot seem to get below 200 pounds.   Most recent fall risk assessment:    01/12/2022    2:41 PM  Fall Risk   Falls in the past year? 0  Number falls in past yr: 0  Injury with Fall? 0  Risk for fall due to : No Fall Risks  Follow up Falls evaluation completed     Most recent depression screenings:    01/12/2022    3:10 PM 06/03/2020    4:06 PM  PHQ 2/9 Scores  PHQ - 2 Score 1 1  PHQ- 9 Score 4 3        Patient Care Team: Agapito Games, MD as PCP - General   Outpatient Medications Prior to Visit  Medication Sig   azelastine (ASTELIN) 0.1 % nasal spray Place 2 sprays into both nostrils 2 (two) times daily. Use in each nostril as directed   Bacillus Coagulans-Inulin (PROBIOTIC) 1-250 BILLION-MG CAPS Take by mouth daily at 6 (six) AM.   ferrous sulfate 325 (65 FE) MG EC tablet Take 325 mg by mouth daily with breakfast.   potassium chloride (KLOR-CON) 10 MEQ tablet TAKE 1 TABLET (10 MEQ TOTAL) BY MOUTH IN THE MORNING AND AT BEDTIME. LAST REFILL. DUE FOR A VISIT WITH PCP   [DISCONTINUED] benzonatate (TESSALON) 200 MG capsule Take 1 capsule (200 mg total) by mouth 3 (three) times daily as needed for cough.   [DISCONTINUED] citalopram (CELEXA) 20 MG tablet Take 1 tablet (20 mg total) by mouth daily.   [DISCONTINUED] traZODone (DESYREL) 50 MG tablet TAKE 0.5-1.5 TABLETS (25-75 MG TOTAL) BY MOUTH AT BEDTIME AS NEEDED FOR SLEEP.   No  facility-administered medications prior to visit.    ROS        Objective:     BP 136/74   Pulse 61   Ht 5\' 7"  (1.702 m)   Wt 206 lb (93.4 kg)   SpO2 100%   BMI 32.26 kg/m    Physical Exam Vitals and nursing note reviewed. Exam conducted with a chaperone present.  Constitutional:      Appearance: She is well-developed.  HENT:     Head: Normocephalic and atraumatic.     Right Ear: External ear normal.     Left Ear: External ear normal.     Nose: Nose normal.  Eyes:     Conjunctiva/sclera: Conjunctivae normal.     Pupils: Pupils are equal, round, and reactive to light.  Neck:     Thyroid: No thyromegaly.  Cardiovascular:     Rate and Rhythm: Normal rate and regular rhythm.     Heart sounds: Normal heart sounds.  Pulmonary:     Effort: Pulmonary effort is normal.     Breath sounds: Normal breath sounds. No wheezing.  Chest:  Breasts:    Right: Inverted nipple present. No mass.     Left: Inverted nipple present. No mass.  Abdominal:  General: Bowel sounds are normal.     Palpations: Abdomen is soft.  Musculoskeletal:     Cervical back: Neck supple.  Lymphadenopathy:     Cervical: No cervical adenopathy.     Upper Body:     Right upper body: No supraclavicular, axillary or pectoral adenopathy.     Left upper body: No supraclavicular, axillary or pectoral adenopathy.  Skin:    General: Skin is warm and dry.     Coloration: Skin is not pale.  Neurological:     Mental Status: She is alert and oriented to person, place, and time.  Psychiatric:        Behavior: Behavior normal.      No results found for any visits on 01/12/22.     Assessment & Plan:    Routine Health Maintenance and Physical Exam  Immunization History  Administered Date(s) Administered   Hep A / Hep B 06/12/2017   Influenza Split 01/25/2012   Influenza,inj,Quad PF,6+ Mos 03/24/2013   Influenza-Unspecified 01/06/2015   PPD Test 03/24/2013, 06/12/2017   Tdap 10/06/2010, 09/16/2015     Health Maintenance  Topic Date Due   INFLUENZA VACCINE  07/16/2022 (Originally 11/15/2021)   COVID-19 Vaccine (1) 01/29/2023 (Originally 02/22/1981)   PAP SMEAR-Modifier  06/22/2024   TETANUS/TDAP  09/15/2025   Hepatitis C Screening  Completed   HIV Screening  Completed   Pneumococcal Vaccine 74-15 Years old  Aged Out   HPV VACCINES  Aged Out    Discussed health benefits of physical activity, and encouraged her to engage in regular exercise appropriate for her age and condition.  Problem List Items Addressed This Visit       Other   INSOMNIA   Relevant Medications   traZODone (DESYREL) 50 MG tablet   Other Visit Diagnoses     Wellness examination    -  Primary   Relevant Orders   Lipid panel   COMPLETE METABOLIC PANEL WITH GFR   CBC   GAD (generalized anxiety disorder)       Relevant Medications   citalopram (CELEXA) 20 MG tablet   traZODone (DESYREL) 50 MG tablet   Breast screening       Relevant Orders   MM 3D SCREEN BREAST BILATERAL      Keep up a regular exercise program and make sure you are eating a healthy diet Try to eat 4 servings of dairy a day, or if you are lactose intolerant take a calcium with vitamin D daily.  Your vaccines are up to date.  Discussed some strategies around weight loss I recommend that she use one of the calorie counter such as my fitness pal or lose it.  And setting small goals for herself.  Also encouraged regular exercise she says she plans on getting back into the gym.   Return in about 1 year (around 01/13/2023) for Wellness Exam.     Beatrice Lecher, MD

## 2022-01-13 NOTE — Progress Notes (Signed)
Hi [name], your hemoglobin was down a little from last year.  Down to 10.8.  Your cholesterol is also up compared to a year ago. Encourage you to work on your diet and try to be consistent with exercise.  Metabolic panel looks good.   Please call lab and see if we can add iron level.

## 2022-01-19 NOTE — Progress Notes (Signed)
HI [name], your total iron is OK.   Let plan to recheck your CBC and iron panel in 6 months.

## 2022-02-02 ENCOUNTER — Telehealth (HOSPITAL_BASED_OUTPATIENT_CLINIC_OR_DEPARTMENT_OTHER): Payer: Self-pay

## 2022-02-09 LAB — COMPLETE METABOLIC PANEL WITH GFR
AG Ratio: 1.4 (calc) (ref 1.0–2.5)
ALT: 7 U/L (ref 6–29)
AST: 11 U/L (ref 10–30)
Albumin: 4.3 g/dL (ref 3.6–5.1)
Alkaline phosphatase (APISO): 66 U/L (ref 31–125)
BUN: 11 mg/dL (ref 7–25)
CO2: 26 mmol/L (ref 20–32)
Calcium: 9.1 mg/dL (ref 8.6–10.2)
Chloride: 106 mmol/L (ref 98–110)
Creat: 0.61 mg/dL (ref 0.50–0.99)
Globulin: 3.1 g/dL (calc) (ref 1.9–3.7)
Glucose, Bld: 80 mg/dL (ref 65–99)
Potassium: 3.6 mmol/L (ref 3.5–5.3)
Sodium: 141 mmol/L (ref 135–146)
Total Bilirubin: 0.7 mg/dL (ref 0.2–1.2)
Total Protein: 7.4 g/dL (ref 6.1–8.1)
eGFR: 116 mL/min/{1.73_m2} (ref >=60)

## 2022-02-09 LAB — CBC
HCT: 33.6 % — ABNORMAL LOW (ref 35.0–45.0)
Hemoglobin: 10.8 g/dL — ABNORMAL LOW (ref 11.7–15.5)
MCH: 25.7 pg — ABNORMAL LOW (ref 27.0–33.0)
MCHC: 32.1 g/dL (ref 32.0–36.0)
MCV: 79.8 fL — ABNORMAL LOW (ref 80.0–100.0)
MPV: 11.3 fL (ref 7.5–12.5)
Platelets: 317 10*3/uL (ref 140–400)
RBC: 4.21 10*6/uL (ref 3.80–5.10)
RDW: 13.3 % (ref 11.0–15.0)
WBC: 7.3 10*3/uL (ref 3.8–10.8)

## 2022-02-09 LAB — IRON: Iron: 75 ug/dL (ref 40–190)

## 2022-02-09 LAB — LIPID PANEL
Cholesterol: 244 mg/dL — ABNORMAL HIGH (ref <200)
HDL: 65 mg/dL (ref >=50)
LDL Cholesterol (Calc): 160 mg/dL (calc) — ABNORMAL HIGH
Non-HDL Cholesterol (Calc): 179 mg/dL (calc) — ABNORMAL HIGH (ref <130)
Total CHOL/HDL Ratio: 3.8 (calc) (ref <5.0)
Triglycerides: 84 mg/dL (ref <150)

## 2022-02-09 LAB — TEST AUTHORIZATION

## 2022-03-22 ENCOUNTER — Ambulatory Visit (HOSPITAL_BASED_OUTPATIENT_CLINIC_OR_DEPARTMENT_OTHER): Payer: 59

## 2022-03-29 ENCOUNTER — Inpatient Hospital Stay (HOSPITAL_BASED_OUTPATIENT_CLINIC_OR_DEPARTMENT_OTHER): Admission: RE | Admit: 2022-03-29 | Payer: 59 | Source: Ambulatory Visit

## 2022-05-23 ENCOUNTER — Other Ambulatory Visit: Payer: Self-pay | Admitting: Family Medicine

## 2022-05-25 ENCOUNTER — Encounter (HOSPITAL_BASED_OUTPATIENT_CLINIC_OR_DEPARTMENT_OTHER): Payer: Self-pay

## 2022-05-25 ENCOUNTER — Ambulatory Visit (HOSPITAL_BASED_OUTPATIENT_CLINIC_OR_DEPARTMENT_OTHER)
Admission: RE | Admit: 2022-05-25 | Discharge: 2022-05-25 | Disposition: A | Discharge status: Home / Self Care | Payer: 59 | Source: Ambulatory Visit | Attending: Family Medicine | Admitting: Family Medicine

## 2022-05-25 DIAGNOSIS — Z1231 Encounter for screening mammogram for malignant neoplasm of breast: Secondary | ICD-10-CM | POA: Diagnosis not present

## 2022-05-25 DIAGNOSIS — Z1239 Encounter for other screening for malignant neoplasm of breast: Secondary | ICD-10-CM

## 2022-05-25 NOTE — Telephone Encounter (Signed)
Pt is scheduled for 06/07/2022 @ 8:50 tvt

## 2022-05-25 NOTE — Telephone Encounter (Signed)
Please call pt and have her schedule a f/u appointment with labs for refills.

## 2022-06-01 NOTE — Progress Notes (Signed)
Please call patient. Normal mammogram.  Repeat in 1 year.  

## 2022-06-07 ENCOUNTER — Encounter: Payer: Self-pay | Admitting: Family Medicine

## 2022-06-07 ENCOUNTER — Ambulatory Visit: Payer: 59 | Admitting: Family Medicine

## 2022-06-07 VITALS — BP 131/72 | HR 72 | Ht 67.0 in | Wt 201.0 lb

## 2022-06-07 DIAGNOSIS — Z7689 Persons encountering health services in other specified circumstances: Secondary | ICD-10-CM | POA: Diagnosis not present

## 2022-06-07 DIAGNOSIS — D500 Iron deficiency anemia secondary to blood loss (chronic): Secondary | ICD-10-CM | POA: Diagnosis not present

## 2022-06-07 DIAGNOSIS — F411 Generalized anxiety disorder: Secondary | ICD-10-CM | POA: Diagnosis not present

## 2022-06-07 DIAGNOSIS — E876 Hypokalemia: Secondary | ICD-10-CM | POA: Diagnosis not present

## 2022-06-07 DIAGNOSIS — G4700 Insomnia, unspecified: Secondary | ICD-10-CM | POA: Diagnosis not present

## 2022-06-07 DIAGNOSIS — R69 Illness, unspecified: Secondary | ICD-10-CM | POA: Diagnosis not present

## 2022-06-07 MED ORDER — FERROUS SULFATE 325 (65 FE) MG PO TBEC: 325.0000 mg | DELAYED_RELEASE_TABLET | Freq: Every day | ORAL | 1 refills | Status: DC

## 2022-06-07 NOTE — Assessment & Plan Note (Signed)
Back on citalopram 20 mg and feels like she is getting better.  Continue current regimen.  If we need to make any dose adjustment then please let us know otherwise I will see her back in 6 months.

## 2022-06-07 NOTE — Progress Notes (Signed)
Established Patient Office Visit  Subjective   Patient ID: [name], female    DOB: [birth year]  Age: 42 y.o. MRN: [id number]  Chief Complaint  Patient presents with   Anxiety   Anemia    HPI  F/U anxiety -he is currently on citalopram 20 mg daily and using trazodone at bedtime to sleep.  She actually had quit taking the citalopram for a while because she was doing better but started to feel like she was getting some more anxiety and irritability she had a couple weekends with panic attacks and so decided to restart the citalopram about a week ago she feels like things are starting to level out.  F/U anemia - she is no longer taking her iron tabs. She would like to restart them.    She had also stopped her potassium for a while and just noticed that she was having a lot more leg pains and discomforts and was feeling like they needed to move.  But she restarted her potassium about a week ago and she also feels like that is getting better as well.  Follow-up insomnia-she usually just takes 1 tab of the trazodone but still waking up 2-3 times at night.  Is also been frustrated with her weight she has been hanging around 210 and 211 and just cannot seem to get the scale to move.  She feels like she is eating healthy and has even done some calorie tracking.  She says she is exercising regularly.    ROS    Objective:     BP 131/72   Pulse 72   Ht 5' 7"$  (1.702 m)   Wt 201 lb (91.2 kg)   SpO2 100%   BMI 31.48 kg/m    Physical Exam Vitals and nursing note reviewed.  Constitutional:      Appearance: She is well-developed.  HENT:     Head: Normocephalic and atraumatic.  Cardiovascular:     Rate and Rhythm: Normal rate and regular rhythm.     Heart sounds: Normal heart sounds.  Pulmonary:     Effort: Pulmonary effort is normal.     Breath sounds: Normal breath sounds.  Skin:    General: Skin is warm and dry.  Neurological:     Mental Status: She is alert and  oriented to person, place, and time.  Psychiatric:        Behavior: Behavior normal.      No results found for any visits on 06/07/22.    The 10-year ASCVD risk score (Arnett DK, et al., 2019) is: 0.5%    Assessment & Plan:   Problem List Items Addressed This Visit       Other   INSOMNIA    Try increasing trazodone to 1-1/2 tabs to see if it is helpful and try moving about an hour before bedtime.  If it still not helping and still waking up frequently then please let us know.      ANXIETY DISORDER, GENERALIZED    Back on citalopram 20 mg and feels like she is getting better.  Continue current regimen.  If we need to make any dose adjustment then please let us know otherwise I will see her back in 6 months.      Anemia - Primary    To recheck iron today she has not been taking her iron recently.      Relevant Medications   ferrous sulfate 325 (65 FE) MG EC tablet   Other Relevant  Orders   CBC   Iron   Other Visit Diagnoses     Hypokalemia       Relevant Orders   BASIC METABOLIC PANEL WITH GFR   Encounter for weight management       Relevant Orders   Amb Ref to Medical Weight Management      Weight management-will refer to healthy weight and wellness.  Return in about 6 months (around 12/06/2022) for Mood medication .    Beatrice Lecher, MD

## 2022-06-07 NOTE — Assessment & Plan Note (Signed)
To recheck iron today she has not been taking her iron recently.

## 2022-06-07 NOTE — Assessment & Plan Note (Signed)
Try increasing trazodone to 1-1/2 tabs to see if it is helpful and try moving about an hour before bedtime.  If it still not helping and still waking up frequently then please let us know.

## 2022-06-08 LAB — BASIC METABOLIC PANEL WITH GFR
BUN: 11 mg/dL (ref 7–25)
CO2: 27 mmol/L (ref 20–32)
Calcium: 9.3 mg/dL (ref 8.6–10.2)
Chloride: 106 mmol/L (ref 98–110)
Creat: 0.59 mg/dL (ref 0.50–0.99)
Glucose, Bld: 91 mg/dL (ref 65–99)
Potassium: 4.3 mmol/L (ref 3.5–5.3)
Sodium: 141 mmol/L (ref 135–146)
eGFR: 117 mL/min/{1.73_m2} (ref >=60)

## 2022-06-08 LAB — CBC
HCT: 34.5 % — ABNORMAL LOW (ref 35.0–45.0)
Hemoglobin: 11.3 g/dL — ABNORMAL LOW (ref 11.7–15.5)
MCH: 25.6 pg — ABNORMAL LOW (ref 27.0–33.0)
MCHC: 32.8 g/dL (ref 32.0–36.0)
MCV: 78.1 fL — ABNORMAL LOW (ref 80.0–100.0)
MPV: 11 fL (ref 7.5–12.5)
Platelets: 283 10*3/uL (ref 140–400)
RBC: 4.42 10*6/uL (ref 3.80–5.10)
RDW: 13.7 % (ref 11.0–15.0)
WBC: 7.1 10*3/uL (ref 3.8–10.8)

## 2022-06-08 LAB — IRON: Iron: 61 ug/dL (ref 40–190)

## 2022-06-08 NOTE — Progress Notes (Signed)
HI Feleshia,  Hemoglobin looks great at 11.3.  It is up from 4 months ago which is great so trending towards normal.  Urine level looks good as well.  Metabolic panel normal.

## 2022-06-20 ENCOUNTER — Other Ambulatory Visit: Payer: Self-pay | Admitting: Family Medicine

## 2022-12-05 ENCOUNTER — Ambulatory Visit: Payer: 59 | Admitting: Family Medicine

## 2022-12-05 NOTE — Progress Notes (Deleted)
   Established Patient Office Visit  Subjective   Patient ID: [name], female    DOB: 03-28-1981  Age: 41 y.o. MRN: [id number]  No chief complaint on file.   HPI  F/U GAD -     {History (Optional):23778}  ROS    Objective:     There were no vitals taken for this visit. {Vitals History (Optional):23777}  Physical Exam Vitals and nursing note reviewed.  Constitutional:      Appearance: She is well-developed.  HENT:     Head: Normocephalic and atraumatic.  Cardiovascular:     Rate and Rhythm: Normal rate and regular rhythm.     Heart sounds: Normal heart sounds.  Pulmonary:     Effort: Pulmonary effort is normal.     Breath sounds: Normal breath sounds.  Skin:    General: Skin is warm and dry.  Neurological:     Mental Status: She is alert and oriented to person, place, and time.  Psychiatric:        Behavior: Behavior normal.    No results found for any visits on 12/05/22.  {Labs (Optional):23779}  The 10-year ASCVD risk score (Arnett DK, et al., 2019) is: 0.6%    Assessment & Plan:   Problem List Items Addressed This Visit       Other   ANXIETY DISORDER, GENERALIZED - Primary    No follow-ups on file.    Nani Gasser, MD

## 2023-08-28 ENCOUNTER — Encounter: Payer: Self-pay | Admitting: Medical-Surgical

## 2023-10-03 ENCOUNTER — Ambulatory Visit (INDEPENDENT_AMBULATORY_CARE_PROVIDER_SITE_OTHER): Payer: Self-pay | Admitting: Family Medicine

## 2023-10-03 ENCOUNTER — Encounter: Payer: Self-pay | Admitting: Family Medicine

## 2023-10-03 VITALS — BP 136/83 | HR 70 | Ht 67.0 in | Wt 220.0 lb

## 2023-10-03 DIAGNOSIS — F411 Generalized anxiety disorder: Secondary | ICD-10-CM | POA: Diagnosis not present

## 2023-10-03 DIAGNOSIS — Z7689 Persons encountering health services in other specified circumstances: Secondary | ICD-10-CM | POA: Diagnosis not present

## 2023-10-03 DIAGNOSIS — Z0000 Encounter for general adult medical examination without abnormal findings: Secondary | ICD-10-CM | POA: Diagnosis not present

## 2023-10-03 DIAGNOSIS — G4700 Insomnia, unspecified: Secondary | ICD-10-CM

## 2023-10-03 MED ORDER — PHENTERMINE HCL 15 MG PO CAPS: 15.0000 mg | ORAL_CAPSULE | ORAL | 0 refills | Status: DC

## 2023-10-03 MED ORDER — TRAZODONE HCL 50 MG PO TABS: 25.0000 mg | ORAL_TABLET | Freq: Every evening | ORAL | 3 refills | Status: AC | PRN

## 2023-10-03 MED ORDER — CITALOPRAM HYDROBROMIDE 20 MG PO TABS: 20.0000 mg | ORAL_TABLET | Freq: Every day | ORAL | 3 refills | Status: AC

## 2023-10-03 NOTE — Progress Notes (Addendum)
 Complete physical exam  Patient: [name]   DOB: 03-04-1981   43 y.o. Female  MRN: [id number]  Subjective:    Chief Complaint  Patient presents with   Annual Exam    [name] is a 43 y.o. female who presents today for a complete physical exam. She reports consuming a general diet. Walking daily in AM and some strength training She generally feels well. She reports sleeping well. She does not have additional problems to discuss today.   She has been really frustrated with her weight she says that she has been using an app to help her track calories she has been walking every day for exercise she has been doing some resistance training she says she will lose about 5 pounds but then seems to gain it right back.  She is just really struggling and getting frustrated and is interested in medication.   Most recent fall risk assessment:    10/03/2023    9:16 AM  Fall Risk   Falls in the past year? 0  Number falls in past yr: 0  Injury with Fall? 0  Risk for fall due to : No Fall Risks  Follow up Falls evaluation completed     Most recent depression screenings:    10/03/2023   10:14 AM 06/07/2022    9:09 AM  PHQ 2/9 Scores  PHQ - 2 Score 0 0  PHQ- 9 Score 3 5        Patient Care Team: Cydney Draft, MD as PCP - General   Outpatient Medications Prior to Visit  Medication Sig   Bacillus Coagulans-Inulin (PROBIOTIC) 1-250 BILLION-MG CAPS Take by mouth daily at 6 (six) AM.   ferrous sulfate  325 (65 FE) MG EC tablet Take 1 tablet (325 mg total) by mouth daily with breakfast.   potassium chloride  (KLOR-CON ) 10 MEQ tablet Take 1 tablet in the morning and at bedtime. NO REFILLS TO BE GIVEN. APPOINTMENT REQUIRED   [DISCONTINUED] citalopram  (CELEXA ) 20 MG tablet Take 1 tablet (20 mg total) by mouth daily.   [DISCONTINUED] traZODone  (DESYREL ) 50 MG tablet Take 0.5-1.5 tablets (25-75 mg total) by mouth at bedtime as needed for sleep.   No facility-administered  medications prior to visit.    ROS        Objective:     BP 136/83   Pulse 70   Ht 5' 7 (1.702 m)   Wt 220 lb (99.8 kg)   SpO2 100%   BMI 34.46 kg/m     Physical Exam Exam conducted with a chaperone present.  Constitutional:      Appearance: Normal appearance.  HENT:     Head: Normocephalic and atraumatic.     Right Ear: Tympanic membrane, ear canal and external ear normal.     Left Ear: Tympanic membrane, ear canal and external ear normal.     Nose: Nose normal.     Mouth/Throat:     Pharynx: Oropharynx is clear.   Eyes:     Extraocular Movements: Extraocular movements intact.     Conjunctiva/sclera: Conjunctivae normal.     Pupils: Pupils are equal, round, and reactive to light.   Neck:     Thyroid : No thyromegaly.   Cardiovascular:     Rate and Rhythm: Normal rate and regular rhythm.  Pulmonary:     Effort: Pulmonary effort is normal.     Breath sounds: Normal breath sounds.  Chest:     Chest wall: No mass.  Breasts:  Right: Normal. No mass, nipple discharge or skin change.     Left: Normal. No mass, nipple discharge or skin change.  Abdominal:     General: Bowel sounds are normal.     Palpations: Abdomen is soft.     Tenderness: There is no abdominal tenderness.   Musculoskeletal:        General: No swelling.     Cervical back: Neck supple.  Lymphadenopathy:     Upper Body:     Right upper body: No supraclavicular, axillary or pectoral adenopathy.     Left upper body: No supraclavicular, axillary or pectoral adenopathy.   Skin:    General: Skin is warm and dry.   Neurological:     Mental Status: She is oriented to person, place, and time.   Psychiatric:        Mood and Affect: Mood normal.        Behavior: Behavior normal.      No results found for any visits on 10/03/23.      Assessment & Plan:    Routine Health Maintenance and Physical Exam  Immunization History  Administered Date(s) Administered   Hep A / Hep B 06/12/2017    Influenza Split 01/25/2012   Influenza,inj,Quad PF,6+ Mos 03/24/2013   Influenza-Unspecified 01/06/2015   PPD Test 03/24/2013, 06/12/2017   Tdap 10/06/2010, 09/16/2015    Health Maintenance  Topic Date Due   HPV VACCINES (1 - 3-dose SCDM series) Never done   COVID-19 Vaccine (1 - 2024-25 season) Never done   INFLUENZA VACCINE  11/16/2023   Cervical Cancer Screening (HPV/Pap Cotest)  06/22/2024   DTaP/Tdap/Td (3 - Td or Tdap) 09/15/2025   Hepatitis C Screening  Completed   HIV Screening  Completed   Meningococcal B Vaccine  Aged Out    Discussed health benefits of physical activity, and encouraged her to engage in regular exercise appropriate for her age and condition.  Problem List Items Addressed This Visit       Other   INSOMNIA   Relevant Medications   traZODone  (DESYREL ) 50 MG tablet   Encounter for weight management   Visit #: 1 Starting Weight: 220 lbs  Current weight:   Previous weight: 201 lbs  Change in weight: up 19 lbs   Goal weight:  Dietary goals: discussed options, recommend 1200-1400 per days.   Exercise goals: walking daily for exercise.  Medication: Phentermine 15mg  QAM.  Discussed potential S.E. monitor BP and HR Follow-up and referrals: 4 weeks.         Other Visit Diagnoses       Wellness examination    -  Primary   Relevant Orders   CMP14+EGFR   Lipid panel   CBC   TSH     GAD (generalized anxiety disorder)       Relevant Medications   citalopram  (CELEXA ) 20 MG tablet   traZODone  (DESYREL ) 50 MG tablet      Keep up a regular exercise program and make sure you are eating a healthy diet Try to eat 4 servings of dairy a day, or if you are lactose intolerant take a calcium with vitamin D  daily.  Your vaccines are up to date.   No follow-ups on file.     Duaine German, MD

## 2023-10-03 NOTE — Assessment & Plan Note (Addendum)
 Visit #: 1 Starting Weight: 220 lbs  Current weight:   Previous weight: 201 lbs  Change in weight: up 19 lbs   Goal weight:  Dietary goals: discussed options, recommend 1200-1400 per days.   Exercise goals: walking daily for exercise.  Medication: Phentermine 15mg  QAM.  Discussed potential S.E. monitor BP and HR Follow-up and referrals: 4 weeks.

## 2023-10-04 LAB — LIPID PANEL
Chol/HDL Ratio: 3.6 ratio (ref 0.0–4.4)
Cholesterol, Total: 261 mg/dL — ABNORMAL HIGH (ref 100–199)
HDL: 72 mg/dL (ref >39)
LDL Chol Calc (NIH): 176 mg/dL — ABNORMAL HIGH (ref 0–99)
Triglycerides: 78 mg/dL (ref 0–149)
VLDL Cholesterol Cal: 13 mg/dL (ref 5–40)

## 2023-10-04 LAB — CMP14+EGFR
ALT: 8 IU/L (ref 0–32)
AST: 14 IU/L (ref 0–40)
Albumin: 4.4 g/dL (ref 3.9–4.9)
Alkaline Phosphatase: 76 IU/L (ref 44–121)
BUN/Creatinine Ratio: 18 (ref 9–23)
BUN: 12 mg/dL (ref 6–24)
Bilirubin Total: 0.3 mg/dL (ref 0.0–1.2)
CO2: 19 mmol/L — ABNORMAL LOW (ref 20–29)
Calcium: 9 mg/dL (ref 8.7–10.2)
Chloride: 106 mmol/L (ref 96–106)
Creatinine, Ser: 0.65 mg/dL (ref 0.57–1.00)
Globulin, Total: 2.7 g/dL (ref 1.5–4.5)
Glucose: 92 mg/dL (ref 70–99)
Potassium: 4.2 mmol/L (ref 3.5–5.2)
Sodium: 141 mmol/L (ref 134–144)
Total Protein: 7.1 g/dL (ref 6.0–8.5)
eGFR: 113 mL/min/{1.73_m2} (ref >59)

## 2023-10-04 LAB — CBC
Hematocrit: 35.5 % (ref 34.0–46.6)
Hemoglobin: 11.3 g/dL (ref 11.1–15.9)
MCH: 26.1 pg — ABNORMAL LOW (ref 26.6–33.0)
MCHC: 31.8 g/dL (ref 31.5–35.7)
MCV: 82 fL (ref 79–97)
Platelets: 300 10*3/uL (ref 150–450)
RBC: 4.33 x10E6/uL (ref 3.77–5.28)
RDW: 13.9 % (ref 11.7–15.4)
WBC: 7.2 10*3/uL (ref 3.4–10.8)

## 2023-10-04 LAB — TSH: TSH: 1.24 u[IU]/mL (ref 0.450–4.500)

## 2023-10-05 ENCOUNTER — Ambulatory Visit: Payer: Self-pay | Admitting: Family Medicine

## 2023-10-05 NOTE — Progress Notes (Signed)
 Good morning, your metabolic panel overall looks good.  LDL cholesterol is quite elevated at 176.  Normals less than 100.  Your cardiovascular risk were still pretty low but I do want you to continue to work on healthy diet and regular exercise to lower your cholesterol numbers if you want to take a look online at the Mediterranean diet.  It is fantastic for cardiovascular health.  Your blood count looks good.  Hemoglobin is stable.  Thyroid  level looks great.  The 10-year ASCVD risk score (Arnett DK, et al., 2019) is: 0.7%   Values used to calculate the score:     Age: 43 years     Clincally relevant sex: Female     Is Non-Hispanic African American: Yes     Diabetic: No     Tobacco smoker: No     Systolic Blood Pressure: 136 mmHg     Is BP treated: No     HDL Cholesterol: 72 mg/dL     Total Cholesterol: 261 mg/dL

## 2023-10-31 ENCOUNTER — Ambulatory Visit (INDEPENDENT_AMBULATORY_CARE_PROVIDER_SITE_OTHER): Admitting: Family Medicine

## 2023-10-31 VITALS — BP 124/77 | HR 64 | Ht 67.0 in | Wt 214.0 lb

## 2023-10-31 DIAGNOSIS — Z713 Dietary counseling and surveillance: Secondary | ICD-10-CM | POA: Diagnosis not present

## 2023-10-31 DIAGNOSIS — Z7689 Persons encountering health services in other specified circumstances: Secondary | ICD-10-CM

## 2023-10-31 MED ORDER — PHENTERMINE HCL 15 MG PO CAPS: 15.0000 mg | ORAL_CAPSULE | ORAL | 1 refills | Status: DC

## 2023-10-31 NOTE — Assessment & Plan Note (Addendum)
 Visit #: 2 Starting Weight: 220 lbs   Current weight:  214 lbs  Previous weight: 220 lbs  lbs  Change in weight: down 6 lbs    Goal weight: Will set a goal at next visit. Dietary goals: discussed options, recommend 1200-1400 per days.  Continue to work on getting protein in with each meal and reviewed some plant and meat options for extra protein. Exercise goals: walking daily for exercise.  He is added in some resistance training as well which is fantastic. Medication: Phentermine  15mg  QAM.  Will refill 15 mg for the next 8 weeks. Follow-up and referrals: 8 weeks.

## 2023-10-31 NOTE — Progress Notes (Unsigned)
 Pt is doing well on weight loss suppressant

## 2023-10-31 NOTE — Progress Notes (Unsigned)
   Established Patient Office Visit  Subjective  Patient ID: [name], female    DOB: [birth year]  Age: 43 y.o. MRN: [id number]  Chief Complaint  Patient presents with   Weight Check    HPI  Here for weight mgt -she recently just darted 15 mg of phentermine .  She said she is actually doing really well she noticed a few things when she first started it but currently is not having any side effects.  No chest pain shortness of breath or palpitations.  She did take the medication today.  Has been getting a daily morning walk-in and even doing about 20 minutes of things like push-ups sit ups and weight resistance training.  He is continuing to work on try to up her protein intake still a little bit of a struggle but otherwise doing well avoids sugary beverages.    ROS    Objective:     BP 124/77   Pulse 64   Ht 5' 7 (1.702 m)   Wt 214 lb (97.1 kg)   SpO2 100%   BMI 33.52 kg/m    Physical Exam Vitals and nursing note reviewed.  Constitutional:      Appearance: Normal appearance.  HENT:     Head: Normocephalic and atraumatic.  Eyes:     Conjunctiva/sclera: Conjunctivae normal.  Cardiovascular:     Rate and Rhythm: Normal rate and regular rhythm.  Pulmonary:     Effort: Pulmonary effort is normal.     Breath sounds: Normal breath sounds.  Skin:    General: Skin is warm and dry.  Neurological:     Mental Status: She is alert.  Psychiatric:        Mood and Affect: Mood normal.    No results found for any visits on 10/31/23.    The 10-year ASCVD risk score (Arnett DK, et al., 2019) is: 0.4%    Assessment & Plan:   Problem List Items Addressed This Visit       Other   Encounter for weight management - Primary   Visit #: 2 Starting Weight: 220 lbs   Current weight:  214 lbs  Previous weight: 220 lbs  lbs  Change in weight: down 6 lbs    Goal weight: Will set a goal at next visit. Dietary goals: discussed options, recommend 1200-1400 per days.   Continue to work on getting protein in with each meal and reviewed some plant and meat options for extra protein. Exercise goals: walking daily for exercise.  He is added in some resistance training as well which is fantastic. Medication: Phentermine  15mg  QAM.  Will refill 15 mg for the next 8 weeks. Follow-up and referrals: 8 weeks.        Return in about 8 weeks (around 12/26/2023) for weight mgt .    Dorothyann Byars, MD

## 2023-11-01 ENCOUNTER — Encounter: Payer: Self-pay | Admitting: Family Medicine

## 2023-12-18 ENCOUNTER — Ambulatory Visit: Payer: Self-pay

## 2023-12-18 NOTE — Telephone Encounter (Signed)
 FYI Only or Action Required?: FYI only for provider.  Patient was last seen in primary care on 10/31/2023 by Alvan Dorothyann BIRCH, MD.  Called Nurse Triage reporting Tick Removal.  Symptoms began yesterday.  Interventions attempted: Nothing.  Symptoms are: unchanged.  Triage Disposition: See Physician Within 24 Hours  Patient/caregiver understands and will follow disposition?: Yes     Copied from CRM #[id number]. Topic: Clinical - Red Word Triage >> Dec 18, 2023  8:34 AM Zane F wrote: [facility] that prompted transfer to Nurse Triage:   Huge tick found on the back of her head yesterday Reason for Disposition  [1] 2 to 14 days following tick bite AND [2] fever AND [3] no rash or headache  Answer Assessment - Initial Assessment Questions 1. ATTACHED:  Is the tick still on the skin?  (e.g., yes, no, unsure)     Removed by a nurse friend 2. ONSET - TICK STILL ATTACHED:  How long do you think the tick has been on your skin? (e.g., hours, days, unsure)  Note:  Is there a recent activity (camping, hiking) where the caller may have been exposed?     removed 3. ONSET - TICK NOT STILL ATTACHED: If the tick has been removed, how long do you think the tick was attached before you removed it? (e.g., 5 hours, 2 days). When was this?     Engorged when removed yesterday 4. LOCATION: Where is the tick bite located? (e.g., arm, leg)     Back of head 5. TYPE of TICK: Is it a wood tick or a deer tick? (e.g., deer tick, wood tick; unsure)     unsure 6. SIZE of TICK: How big is the tick? (e.g., size of poppy seed, apple seed, watermelon seed; unsure) Note: Deer ticks can be the size of a poppy seed (nymph) or an apple seed (adult).       Watermelon seed 7. ENGORGED: Did the tick look flat or engorged (full, swollen)? (e.g., flat, engorged; unsure)     engorged 8. OTHER SYMPTOMS: Do you have any other symptoms? (e.g., fever, rash, redness at bite area, red ring around bite)      Red ring, fatigue, headache 9. PREGNANCY: Is there any chance you are pregnant? When was your last menstrual period?     na  Protocols used: Tick Bite-A-AH

## 2023-12-18 NOTE — Telephone Encounter (Signed)
 Patient scheduled for 12/19/2023 with Whitney crain

## 2023-12-19 ENCOUNTER — Ambulatory Visit (INDEPENDENT_AMBULATORY_CARE_PROVIDER_SITE_OTHER): Admitting: Urgent Care

## 2023-12-19 VITALS — BP 146/78 | HR 71 | Temp 98.4°F | Resp 20 | Ht 67.0 in | Wt 209.0 lb

## 2023-12-19 DIAGNOSIS — B882 Other arthropod infestations: Secondary | ICD-10-CM

## 2023-12-19 MED ORDER — DOXYCYCLINE HYCLATE 100 MG PO TABS: 100.0000 mg | ORAL_TABLET | Freq: Two times a day (BID) | ORAL | 0 refills | Status: DC

## 2023-12-19 NOTE — Patient Instructions (Signed)
 Please start doxycycline  twice daily x 10 days. Take 1 hour before or 2 hours after food. Do not take multivitamins or iron supplements while on the doxy to promote maximal absorption. Avoid excessive sun exposure on the antibiotic to prevent sunburn.

## 2023-12-19 NOTE — Progress Notes (Signed)
 Established Patient Office Visit  Subjective:  Patient ID: [name], female    DOB: [birth year]  Age: 43 y.o. MRN: [id number]  Chief Complaint  Patient presents with   Tick Removal    bite    HPI  Discussed the use of AI scribe software for clinical note transcription with the patient, who gave verbal consent to proceed.  History of Present Illness   [name] is a 43 year old female who presents with concerns about a tick bite and potential tick-borne illness.  Less than two weeks ago, she discovered a tick in her hair. The exact date of attachment is unknown, but the tick was large. She experienced significant anxiety upon discovering the tick, leading to stress-related behaviors such as running around her yard multiple times.  She feels dizzy, which she attributes to elevated blood pressure due to stress from the tick incident and other factors. There is a red ring on her scalp where the tick was attached, which remains tender. She is unsure if the area is still red but confirms it is still tender.  She has a history of taking doxycycline  and believes she has tolerated it well in the past.       Patient Active Problem List   Diagnosis Date Noted   Encounter for weight management 10/03/2023   Ruptured right tubal ectopic pregnancy causing hemoperitoneum 03/13/2017   Vitamin D  deficiency 01/27/2016   Preeclampsia in postpartum period 11/27/2015   ANXIETY DISORDER, GENERALIZED 12/28/2009   FATIGUE 08/31/2009   Anemia 11/18/2008   INSOMNIA 06/30/2008   Past Medical History:  Diagnosis Date   Abnormal pap    cryo   Anemia    Chest pain    H/O anxiety disorder    Preeclampsia in postpartum period 11/27/2015   Vaginal Pap smear, abnormal    Past Surgical History:  Procedure Laterality Date   cervical cryo     LAPAROSCOPY N/A 03/13/2017   Procedure: LAPAROSCOPY OPERATIVE;  Surgeon: Eveline Lynwood MATSU, MD;  Location: [facility];  Service: Gynecology;  Laterality:  N/A;   UNILATERAL SALPINGECTOMY Right 03/13/2017   Procedure: UNILATERAL SALPINGECTOMY;  Surgeon: Eveline Lynwood MATSU, MD;  Location: [facility];  Service: Gynecology;  Laterality: Right;   WISDOM TOOTH EXTRACTION     Social History   Tobacco Use   Smoking status: Never   Smokeless tobacco: Never  Substance Use Topics   Alcohol use: Yes    Alcohol/week: 0.0 standard drinks of alcohol    Comment: socially   Drug use: No      ROS: as noted in HPI  Objective:     BP (!) 146/78 (BP Location: Right Arm, Patient Position: Sitting, Cuff Size: Large)   Pulse 71   Temp 98.4 F (36.9 C) (Oral)   Resp 20   Ht 5' 7 (1.702 m)   Wt 209 lb (94.8 kg)   SpO2 98%   BMI 32.73 kg/m  BP Readings from Last 3 Encounters:  12/19/23 (!) 146/78  10/31/23 124/77  10/03/23 136/83   Wt Readings from Last 3 Encounters:  12/19/23 209 lb (94.8 kg)  10/31/23 214 lb (97.1 kg)  10/03/23 220 lb (99.8 kg)      Physical Exam Vitals and nursing note reviewed.  Constitutional:      General: She is not in acute distress.    Appearance: Normal appearance. She is not ill-appearing, toxic-appearing or diaphoretic.  HENT:     Head: Normocephalic and atraumatic.  Eyes:  General: No scleral icterus.       Right eye: No discharge.        Left eye: No discharge.     Extraocular Movements: Extraocular movements intact.     Pupils: Pupils are equal, round, and reactive to light.  Cardiovascular:     Rate and Rhythm: Normal rate.  Pulmonary:     Effort: Pulmonary effort is normal. No respiratory distress.  Skin:    General: Skin is warm and dry.     Coloration: Skin is not jaundiced.     Findings: No bruising, erythema or rash.     Comments: Small papule at location of tick bite  Neurological:     General: No focal deficit present.     Mental Status: She is alert and oriented to person, place, and time.     Gait: Gait normal.  Psychiatric:        Mood and Affect: Mood normal.        Behavior:  Behavior normal.      No results found for any visits on 12/19/23.    The 10-year ASCVD risk score (Arnett DK, et al., 2019) is: 1%  Assessment & Plan:  Tick-borne disease -     Doxycycline  Hyclate; Take 1 tablet (100 mg total) by mouth 2 (two) times daily.  Dispense: 20 tablet; Refill: 0  Assessment and Plan    Tick bite with possible exposure to tick-borne illness Tick bite occurred less than two weeks ago with engorged tick (based on size in photos, suspect attachment 5-6 days), increasing risk for Lyme disease and ehrlichiosis. Red ring and tenderness present but not erythema migrans. Treatment warranted. - Prescribed doxycycline , 20 tablets, twice daily for 10 days. - Advised taking doxycycline  one hour before or two hours after meals to avoid interaction with dairy or iron. - Warned about potential photosensitivity with doxycycline . - Sent prescription to [facility] in [place].         No follow-ups on file.   Benton LITTIE Gave, PA

## 2023-12-26 ENCOUNTER — Encounter: Payer: Self-pay | Admitting: Family Medicine

## 2023-12-26 ENCOUNTER — Ambulatory Visit (INDEPENDENT_AMBULATORY_CARE_PROVIDER_SITE_OTHER): Admitting: Family Medicine

## 2023-12-26 VITALS — BP 129/67 | HR 75 | Temp 98.0°F | Resp 20 | Ht 67.0 in | Wt 208.0 lb

## 2023-12-26 DIAGNOSIS — Z7689 Persons encountering health services in other specified circumstances: Secondary | ICD-10-CM | POA: Diagnosis not present

## 2023-12-26 DIAGNOSIS — R253 Fasciculation: Secondary | ICD-10-CM | POA: Diagnosis not present

## 2023-12-26 MED ORDER — PHENTERMINE HCL 37.5 MG PO CAPS: 37.5000 mg | ORAL_CAPSULE | ORAL | 1 refills | Status: DC

## 2023-12-26 NOTE — Progress Notes (Signed)
   Established Patient Office Visit  Subjective  Patient ID: [name], female    DOB: Dec 06, 1980  Age: 43 y.o. MRN: [id number]  Chief Complaint  Patient presents with   Weight Check   Knee Pain    HPI  Here today for follow-up weight management-I last saw her on July 16.  Initial starting weight was 220 pounds.  She was down to 214 pounds when I saw her last time.  She is currently on phentermine  15 mg daily in the morning.  We had set some goals including trying to walk daily for exercise and adding in some resistance training.  She reports feeling well on the medication with no chest pain shortness of breath or palpitations.  Having right knee pain hat feels like a flutter on the medial side.      ROS    Objective:     BP 129/67 (BP Location: Right Arm, Patient Position: Sitting, Cuff Size: Normal)   Pulse 75   Temp 98 F (36.7 C) (Oral)   Resp 20   Ht 5' 7 (1.702 m)   Wt 208 lb (94.3 kg)   SpO2 100%   BMI 32.58 kg/m    Physical Exam Vitals and nursing note reviewed.  Constitutional:      Appearance: Normal appearance.  HENT:     Head: Normocephalic and atraumatic.  Eyes:     Conjunctiva/sclera: Conjunctivae normal.  Cardiovascular:     Rate and Rhythm: Normal rate and regular rhythm.  Pulmonary:     Effort: Pulmonary effort is normal.     Breath sounds: Normal breath sounds.  Skin:    General: Skin is warm and dry.  Neurological:     Mental Status: She is alert.  Psychiatric:        Mood and Affect: Mood normal.      No results found for any visits on 12/26/23.    The 10-year ASCVD risk score (Arnett DK, et al., 2019) is: 0.5%    Assessment & Plan:   Problem List Items Addressed This Visit       Other   Encounter for weight management - Primary   Visit #: 3 Starting Weight: 220 lbs    Current weight:  208 lbs  Previous weight: 214 lbs   Change in weight: down  6 lbs  Goal weight: 180-190 lbs  Dietary goals: discussed options,  recommend 1200-1400 per days.  Continue to work on getting protein in with each meal and reviewed some plant and meat options for extra protein. Exercise goals: She is doing an exercise routine 4-5 nights per week and then trying to walk on the weekends. Medication:   Will inc Phentermine  to 37.5 mg  the next 8 weeks. Follow-up and referrals: 8 weeks.      Other Visit Diagnoses       Fasciculation of lower extremity           Fasciculations-we discussed that it is just the muscle actually twitching it is usually benign it may be triggered by changes in activity she did change up her exercise routine recently. Return in about 8 weeks (around 02/20/2024) for Weight mgt.    Dorothyann Byars, MD

## 2023-12-26 NOTE — Assessment & Plan Note (Addendum)
 Visit #: 3 Starting Weight: 220 lbs    Current weight:  208 lbs  Previous weight: 214 lbs   Change in weight: down  6 lbs  Goal weight: 180-190 lbs  Dietary goals: discussed options, recommend 1200-1400 per days.  Continue to work on getting protein in with each meal and reviewed some plant and meat options for extra protein. Exercise goals: She is doing an exercise routine 4-5 nights per week and then trying to walk on the weekends. Medication:   Will inc Phentermine  to 37.5 mg  the next 8 weeks. Follow-up and referrals: 8 weeks.

## 2024-02-25 ENCOUNTER — Ambulatory Visit (INDEPENDENT_AMBULATORY_CARE_PROVIDER_SITE_OTHER): Admitting: Family Medicine

## 2024-02-25 ENCOUNTER — Encounter: Payer: Self-pay | Admitting: Family Medicine

## 2024-02-25 VITALS — BP 134/73 | HR 71 | Ht 67.0 in | Wt 206.0 lb

## 2024-02-25 DIAGNOSIS — Z7689 Persons encountering health services in other specified circumstances: Secondary | ICD-10-CM

## 2024-02-25 DIAGNOSIS — G4700 Insomnia, unspecified: Secondary | ICD-10-CM | POA: Diagnosis not present

## 2024-02-25 MED ORDER — PHENTERMINE HCL 37.5 MG PO CAPS: 37.5000 mg | ORAL_CAPSULE | ORAL | 1 refills | Status: AC

## 2024-02-25 NOTE — Progress Notes (Signed)
 Established Patient Office Visit  Patient ID: [name], female    DOB: Jun 16, 1980  Age: 43 y.o. MRN: [id number] PCP: Alvan Dorothyann BIRCH, MD  Chief Complaint  Patient presents with   Weight Check    Subjective:     HPI  Discussed the use of AI scribe software for clinical note transcription with the patient, who gave verbal consent to proceed.  History of Present Illness [name] is a 43 year old female who presents for follow-up regarding her medication regimen and weight management.  Medication tolerability and adverse effects - Feels well on current medication regimen - No chest pain, unusual sensations, or sleep disturbances related to medication  Sleep disturbances and anxiety - Struggles with sleep generally - Uses trazodone  as needed, particularly on Sundays when anxiety is higher due to anticipation of the work week  Weight management and dietary habits - Experienced a slight weight loss of two pounds since last visit - Diet includes increased protein and salads - Conscious of portion control, especially with carbohydrates - Occasional increased cooking and snacking, sometimes tasting food when not hungry - Balances dietary choices to accommodate holiday indulgences  Physical activity and exercise - Engages in regular physical activity, primarily walking - Notices improvements in clothing fit, which increases motivation - Recently ordered equipment to enhance home exercise routine      ROS    Objective:     BP 134/73   Pulse 71   Ht 5' 7 (1.702 m)   Wt 206 lb (93.4 kg)   SpO2 100%   BMI 32.26 kg/m     Physical Exam Vitals and nursing note reviewed.  Constitutional:      Appearance: Normal appearance.  HENT:     Head: Normocephalic and atraumatic.  Eyes:     Conjunctiva/sclera: Conjunctivae normal.  Cardiovascular:     Rate and Rhythm: Normal rate and regular rhythm.  Pulmonary:     Effort: Pulmonary effort is normal.      Breath sounds: Normal breath sounds.  Skin:    General: Skin is warm and dry.  Neurological:     Mental Status: She is alert.  Psychiatric:        Mood and Affect: Mood normal.      No results found for any visits on 02/25/24.    The 10-year ASCVD risk score (Arnett DK, et al., 2019) is: 0.7%    Assessment & Plan:   Problem List Items Addressed This Visit       Other   INSOMNIA   Continue trazodone  PRN      Encounter for weight management - Primary   Visit #: 4 Starting Weight: 220 lbs    Current weight:  206 lbs  Previous weight: 208 lbs   Change in weight: down 2 lbs  Goal weight: 180-190 lbs  Dietary goals: discussed options, recommend 1200-1400 per days.  Continue to work on getting protein in with each meal and reviewed some plant and meat options for extra protein. Exercise goals: She is doing an exercise routine 4-5 nights per week and then trying to walk on the weekends. Medication:   Continue Phentermine  to 37.5 mg Follow-up and referrals: 8 weeks.       Assessment and Plan Assessment & Plan Obesity Management with phentermine  15 mg daily is effective. No side effects reported. Blood pressure slightly elevated but not due to medication. Weight decreased by two pounds to 202 pounds. Diet and exercise are well-managed. -  Continue phentermine  15 mg oral daily. - Encouraged portion control and balanced diet, especially during the holidays. - Advised to maintain regular exercise routine, including walking and treadmill use.  General Health Maintenance Weight and exercise management contribute to overall health. - Continue current health maintenance strategies, including diet and exercise.    Return in about 2 months (around 04/26/2024) for [employer] .    Dorothyann Byars, MD [facility] at [facility]

## 2024-02-25 NOTE — Assessment & Plan Note (Signed)
Continue trazodone PRN.  

## 2024-02-25 NOTE — Assessment & Plan Note (Signed)
 Visit #: 4 Starting Weight: 220 lbs    Current weight:  206 lbs  Previous weight: 208 lbs   Change in weight: down 2 lbs  Goal weight: 180-190 lbs  Dietary goals: discussed options, recommend 1200-1400 per days.  Continue to work on getting protein in with each meal and reviewed some plant and meat options for extra protein. Exercise goals: She is doing an exercise routine 4-5 nights per week and then trying to walk on the weekends. Medication:   Continue Phentermine  to 37.5 mg Follow-up and referrals: 8 weeks.
